# Patient Record
Sex: Female | Born: 1960 | Race: White | Hispanic: No | Marital: Married | Smoking: Never smoker
Health system: Southern US, Community
[De-identification: ages and names within clinical notes are randomized; demographics above are authoritative.]

## PROBLEM LIST (undated history)

## (undated) DIAGNOSIS — M1990 Unspecified osteoarthritis, unspecified site: Secondary | ICD-10-CM

## (undated) DIAGNOSIS — E7800 Pure hypercholesterolemia, unspecified: Secondary | ICD-10-CM

## (undated) DIAGNOSIS — K5730 Diverticulosis of large intestine without perforation or abscess without bleeding: Secondary | ICD-10-CM

## (undated) DIAGNOSIS — G4700 Insomnia, unspecified: Secondary | ICD-10-CM

## (undated) DIAGNOSIS — R7303 Prediabetes: Secondary | ICD-10-CM

## (undated) DIAGNOSIS — Z973 Presence of spectacles and contact lenses: Secondary | ICD-10-CM

## (undated) DIAGNOSIS — Z8619 Personal history of other infectious and parasitic diseases: Secondary | ICD-10-CM

## (undated) DIAGNOSIS — K219 Gastro-esophageal reflux disease without esophagitis: Secondary | ICD-10-CM

## (undated) DIAGNOSIS — B009 Herpesviral infection, unspecified: Secondary | ICD-10-CM

## (undated) DIAGNOSIS — E782 Mixed hyperlipidemia: Secondary | ICD-10-CM

## (undated) DIAGNOSIS — N393 Stress incontinence (female) (male): Secondary | ICD-10-CM

## (undated) DIAGNOSIS — N819 Female genital prolapse, unspecified: Secondary | ICD-10-CM

## (undated) HISTORY — DX: Pure hypercholesterolemia, unspecified: E78.00

## (undated) HISTORY — PX: WISDOM TOOTH EXTRACTION: SHX21

## (undated) HISTORY — DX: Prediabetes: R73.03

## (undated) HISTORY — PX: EYE SURGERY: SHX253

## (undated) HISTORY — DX: Herpesviral infection, unspecified: B00.9

## (undated) HISTORY — PX: BUNIONECTOMY: SHX129

---
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
## 2002-02-23 ENCOUNTER — Inpatient Hospital Stay (HOSPITAL_COMMUNITY): Admission: AD | Admit: 2002-02-23 | Discharge: 2002-02-24 | Payer: Self-pay | Admitting: Family Medicine

## 2002-02-23 ENCOUNTER — Encounter: Payer: Self-pay | Admitting: Family Medicine

## 2002-06-20 ENCOUNTER — Encounter: Payer: Self-pay | Admitting: Family Medicine

## 2002-06-20 ENCOUNTER — Ambulatory Visit (HOSPITAL_COMMUNITY): Admission: RE | Admit: 2002-06-20 | Discharge: 2002-06-20 | Payer: Self-pay | Admitting: Family Medicine

## 2004-09-17 ENCOUNTER — Ambulatory Visit (HOSPITAL_COMMUNITY): Admission: RE | Admit: 2004-09-17 | Discharge: 2004-09-17 | Payer: Self-pay | Admitting: Obstetrics and Gynecology

## 2011-06-30 ENCOUNTER — Other Ambulatory Visit (HOSPITAL_COMMUNITY): Payer: Self-pay | Admitting: Preventative Medicine

## 2011-06-30 DIAGNOSIS — M25369 Other instability, unspecified knee: Secondary | ICD-10-CM

## 2011-06-30 DIAGNOSIS — M25469 Effusion, unspecified knee: Secondary | ICD-10-CM

## 2011-07-01 ENCOUNTER — Other Ambulatory Visit (HOSPITAL_COMMUNITY): Payer: Self-pay

## 2011-07-16 ENCOUNTER — Other Ambulatory Visit (HOSPITAL_COMMUNITY): Payer: Self-pay

## 2011-07-16 ENCOUNTER — Ambulatory Visit (HOSPITAL_COMMUNITY)
Admission: RE | Admit: 2011-07-16 | Discharge: 2011-07-16 | Disposition: A | Discharge status: Home / Self Care | Payer: BC Managed Care – PPO | Source: Ambulatory Visit | Attending: Preventative Medicine | Admitting: Preventative Medicine

## 2011-07-16 ENCOUNTER — Inpatient Hospital Stay (HOSPITAL_COMMUNITY): Admission: RE | Admit: 2011-07-16 | Payer: Self-pay | Source: Ambulatory Visit

## 2011-07-16 DIAGNOSIS — X500XXA Overexertion from strenuous movement or load, initial encounter: Secondary | ICD-10-CM | POA: Insufficient documentation

## 2011-07-16 DIAGNOSIS — M25569 Pain in unspecified knee: Secondary | ICD-10-CM | POA: Insufficient documentation

## 2011-07-16 DIAGNOSIS — M25469 Effusion, unspecified knee: Secondary | ICD-10-CM | POA: Insufficient documentation

## 2011-07-16 DIAGNOSIS — M25369 Other instability, unspecified knee: Secondary | ICD-10-CM

## 2011-07-16 DIAGNOSIS — IMO0002 Reserved for concepts with insufficient information to code with codable children: Secondary | ICD-10-CM | POA: Insufficient documentation

## 2011-07-24 ENCOUNTER — Ambulatory Visit (HOSPITAL_COMMUNITY)
Admission: RE | Admit: 2011-07-24 | Discharge: 2011-07-24 | Disposition: A | Discharge status: Home / Self Care | Payer: BC Managed Care – PPO | Source: Ambulatory Visit | Attending: Orthopaedic Surgery | Admitting: Orthopaedic Surgery

## 2011-07-24 DIAGNOSIS — IMO0001 Reserved for inherently not codable concepts without codable children: Secondary | ICD-10-CM | POA: Insufficient documentation

## 2011-07-24 DIAGNOSIS — M25569 Pain in unspecified knee: Secondary | ICD-10-CM | POA: Insufficient documentation

## 2011-07-24 DIAGNOSIS — M25669 Stiffness of unspecified knee, not elsewhere classified: Secondary | ICD-10-CM | POA: Insufficient documentation

## 2011-07-24 DIAGNOSIS — R262 Difficulty in walking, not elsewhere classified: Secondary | ICD-10-CM | POA: Insufficient documentation

## 2011-07-24 DIAGNOSIS — M238X9 Other internal derangements of unspecified knee: Secondary | ICD-10-CM | POA: Insufficient documentation

## 2011-07-24 DIAGNOSIS — M6281 Muscle weakness (generalized): Secondary | ICD-10-CM | POA: Insufficient documentation

## 2011-07-30 ENCOUNTER — Telehealth (HOSPITAL_COMMUNITY): Payer: Self-pay

## 2011-07-30 ENCOUNTER — Inpatient Hospital Stay (HOSPITAL_COMMUNITY): Admission: RE | Admit: 2011-07-30 | Discharge: 2011-07-30 | Payer: BC Managed Care – PPO | Source: Ambulatory Visit

## 2011-07-30 NOTE — Progress Notes (Signed)
Physical Therapy Evaluation on 07/24/11  Patient Details  Name: Gabrielle Rodriguez MRN: [id number] Date of Birth: November 28, 1960  Today's Date: 07/30/2011 Time: 850-947 Charges: 1 eval Visit#: 1 of 8 Re-eval: 08/22/11    Past Medical History: No past medical history on file. Past Surgical History: No past surgical history on file.  Subjective  Symptoms/Limitations: Symptoms Injured her R knee while playing sand volleyball on 06/28/11. Waited to see MD about knee pain. Was placed in an immobolizer for a few weeks. Has ambulated independently and now ambulating with a neoprene sleeve.    Precautions/Restrictions     Prior Functioning     Cognition    Sensation/Coordination/Flexibility    Assessment  See Assessment section for strength/ROM and balance  Mobility (including Balance)       Exercise/Treatments  Supine: Quad Sets x5 SLR x10 SAQ x10 Active HS stretch 30sec Heel Slides Prone: SLR x10 Sidelying: Hip adduction x10 Physical Therapy Assessment and Plan PT Assessment and Plan Clinical Impression Statement: For IE on 07/24/11: Pt is a 50 y.o female referred to PT for R knee partial torn ACL.  After examination it was found that the patient has current body structure impairments including increased pain, decreased functional strength, decreased power, impaired balance, decreased ROM, and impaired perceived functional ability which are limiting her in the ability to participate in household and community related activities.  Pt will benefit from skilled physical therapy service to address the above body structure impairments in order to maximize function in order to improve quality of life. Rehab Potential: Good PT Frequency: Min 2X/week PT Duration: 4 weeks PT Treatment/Interventions: Gait training;Stair training;Functional mobility training;Therapeutic exercise;Other (comment) (Manual and modalities for ROM and pain control) PT Plan: Add: bike, steps, SAQ's, TKE, balance  exercise (maybe on foam)    Goals  Home Exercise Program: Pt will Perform Home Exercise Program PT Goal: Perform Home Exercise Program - Progress PT Short Term Goals: Time to Complete Short Term Goals PT Short Term Goal 1 1.Pt will be Independent in HEP in order to maximize therapeutic effect. PT Short Term Goal 1 - Progress PT Short Term Goal 2 2.Pt will report pain less than 2/10 while walking for 15 minutes. PT Short Term Goal 2 - Progress PT Short Term Goal 3 3.Pt will improve AROM to 0-130 PT Short Term Goal 3 - Progress PT Short Term Goal 4 4.Pt will ambulate with appropriate gait mechanics. PT Short Term Goal 4 - Progress PT Short Term Goal 5 5. Pt will descend stairs with 1 handrail and reciprocal gait pattern. PT Short Term Goal 5 - Progress Additional PT Short Term Goals? PT Long Term Goals: PT Long Term Goal 1 1.Pt will increase LE strength to [facility] in order to walk and jog and for an hour to participate in exercise activities. PT Long Term Goal 1 - Progress PT Long Term Goal 2 2.Pt will report pain less than or equal to 2/10 for 75% of her day with workout activities. PT Long Term Goal 2 - Progress Long Term Goal 3 3.Pt will improve her LEFS score by 9 points for improved perceived functional ability Long Term Goal 3 Progress Long Term Goal 4 4.Pt will improve RLE stability in order to demonstrate 5 V-cuts and 5 half kneel to stands without pain or knee popping. Long Term Goal 4 Progress    Problem List Patient Active Problem List  Diagnoses  . ACL laxity  . Knee pain  Kamareon Sciandra 07/30/2011, 10:04 AM  Physician Documentation Your signature is required to indicate approval of the treatment plan as stated above.  Please sign and either send electronically or make a copy of this report for your files and return this physician signed original.   Please mark one 1.__approve of plan  2. ___approve of plan with the following conditions.   ______________________________                                                           _____________________ Physician Signature                                                                                                             Date

## 2011-07-31 ENCOUNTER — Ambulatory Visit (HOSPITAL_COMMUNITY): Payer: BC Managed Care – PPO | Admitting: Physical Therapy

## 2011-07-31 ENCOUNTER — Telehealth (HOSPITAL_COMMUNITY): Payer: Self-pay

## 2011-08-04 ENCOUNTER — Ambulatory Visit (HOSPITAL_COMMUNITY)
Admission: RE | Admit: 2011-08-04 | Discharge: 2011-08-04 | Disposition: A | Discharge status: Home / Self Care | Payer: BC Managed Care – PPO | Source: Ambulatory Visit | Attending: Orthopaedic Surgery | Admitting: Orthopaedic Surgery

## 2011-08-04 NOTE — Progress Notes (Signed)
Physical Therapy Treatment Patient Details  Name: Gabrielle Rodriguez MRN: [id number] Date of Birth: [birth year]  Today's Date: 08/04/2011 Time: 1450-1530 Time Calculation (min): 40 min Visit#: 2  of 8   Re-eval: 08/22/11 Charges:  Therex 28', ice 10'    Subjective: Symptoms/Limitations Symptoms: Pt. states she was on her knee more yesterday and hurting/throbbing a little more today. Pain Assessment Currently in Pain?: Yes Pain Score:   6 Pain Location: Knee Pain Orientation: Right Pain Type:  ("Nagging/Non-stop")  Precautions/Restrictions:   Partially torn R ACL; avoid exercises that cause pain.   Exercise/Treatments Warm Up: Recumbent bike 6' @ 2.0, seat 10 Standing:  Heelraises 10X Hamstring curls 10X Forward step ups 10X 4" step SLS  Max 50" Terminal Knee extension, blue tband 10X5" Supine:  Quad Sets10X5"  SLR x10  SAQ x10 X5" Active HS stretch 30secX 3  Heel Slides 10X Prone:  Hamstring curls 10X Hip Extension x10  Sidelying:  Hip adduction x10   Modalities Modalities: Cryotherapy Cryotherapy Number Minutes Cryotherapy: 10 Minutes Cryotherapy Location: Knee Type of Cryotherapy: Ice pack  Physical Therapy Assessment and Plan PT Assessment and Plan Clinical Impression Statement: Added TKE, heelraises, knee flexion , and forward step ups.  Attempted lateral step-ups, however too painful for patient.   PT Treatment/Interventions: Therapeutic exercise (icepack) PT Plan: Progress strength and stability of right knee per POC.     Problem List Patient Active Problem List  Diagnoses  . ACL laxity  . Knee pain    PT - End of Session Activity Tolerance: Patient tolerated treatment well General Behavior During Session: [facility] for tasks performed Cognition: [facility] for tasks performed  Lurena Nida 08/04/2011, 3:24 PM

## 2011-08-07 ENCOUNTER — Ambulatory Visit (HOSPITAL_COMMUNITY)
Admission: RE | Admit: 2011-08-07 | Discharge: 2011-08-07 | Disposition: A | Discharge status: Home / Self Care | Payer: BC Managed Care – PPO | Source: Ambulatory Visit

## 2011-08-07 NOTE — Progress Notes (Signed)
Physical Therapy Treatment Patient Details  Name: Gabrielle Rodriguez MRN: [id number] Date of Birth: Sep 17, 1961  Today's Date: 08/07/2011 Time: 5621-3086 Time Calculation (min): 42 min Visit#: 3  of 8   Re-eval: 08/22/11  Charge: therex 26 min Ice 10 min  Subjective: Symptoms/Limitations Symptoms: Sore today believe its because I'm doing more.  Pain scale 1-2/10 right now, pain worse at night up to 5-6/10. Pain Assessment Currently in Pain?: Yes Pain Score:   1 Pain Location: Knee Pain Orientation: Right  Precautions/Restrictions: Partially torn R ACL; avoid exercises that cause pain.  Exercise/Treatments Warm Up:  Recumbent bike 6' @ 3.0, seat 10  Standing:  Heelraises 15X  Hamstring curls 10X  Forward step ups 10X 4" step  Step down 10x 4" step Lateral step ups 10x 4" step SLS Max 1\' 12"   Vector stance 5x 5" Terminal Knee extension, blue tband 10X5"  Supine:  Quad Sets10X5"  Heel Slides 15X  SLR x10 3# SAQ x15 X5"  Ice x 10 min  Physical Therapy Assessment and Plan PT Assessment and Plan Clinical Impression Statement: Able to tolerate lateral step ups and forward step down today without c/o of increased pain.  Pt presented good ankle strategy with SLS.  Able to advance therex with no increased pain.  ROM improving, AROM extension lacking 2 degrees. PT Plan: Progress strength and stability of R Knee.  Progress to SLS on foam or rebounder next session.    Goals    Problem List Patient Active Problem List  Diagnoses  . ACL laxity  . Knee pain    PT - End of Session Activity Tolerance: Patient tolerated treatment well General Behavior During Session: [facility] for tasks performed Cognition: [facility] for tasks performed  Juel Burrow 08/07/2011, 11:19 AM

## 2011-08-12 ENCOUNTER — Telehealth (HOSPITAL_COMMUNITY): Payer: Self-pay

## 2011-08-14 ENCOUNTER — Ambulatory Visit (HOSPITAL_COMMUNITY)
Admission: RE | Admit: 2011-08-14 | Discharge: 2011-08-14 | Disposition: A | Discharge status: Home / Self Care | Payer: BC Managed Care – PPO | Source: Ambulatory Visit | Attending: Orthopaedic Surgery | Admitting: Orthopaedic Surgery

## 2011-08-14 DIAGNOSIS — IMO0001 Reserved for inherently not codable concepts without codable children: Secondary | ICD-10-CM | POA: Insufficient documentation

## 2011-08-14 DIAGNOSIS — R262 Difficulty in walking, not elsewhere classified: Secondary | ICD-10-CM | POA: Insufficient documentation

## 2011-08-14 DIAGNOSIS — M25569 Pain in unspecified knee: Secondary | ICD-10-CM | POA: Insufficient documentation

## 2011-08-14 DIAGNOSIS — M25669 Stiffness of unspecified knee, not elsewhere classified: Secondary | ICD-10-CM | POA: Insufficient documentation

## 2011-08-14 DIAGNOSIS — M6281 Muscle weakness (generalized): Secondary | ICD-10-CM | POA: Insufficient documentation

## 2011-08-14 NOTE — Progress Notes (Signed)
Physical Therapy Treatment Patient Details  Name: Gabrielle Rodriguez MRN: [id number] Date of Birth: August 10, 1961  Today's Date: 08/14/2011 Time: 7829-5621 Time Calculation (min): 29 min Visit#: 4  of 8   Re-eval: 08/22/11 Charges:  therex 15', ice 10' (unable to complete full program)   Precautions/Restrictions: Partially torn R ACL; avoid exercises that cause pain.   Subjective: Symptoms/Limitations Symptoms: Pt. 20' late for appt. today and had to leave early for work.  States it still pops at times, especially with going from flexion to extension.  Reports constant, chronic ache in R knee. Pain Assessment Pain Score:   2 Pain Location: Knee Pain Orientation: Right  Exercise/Treatments Warm Up:  Recumbent bike 6' @ 3.0, seat 10  Standing:  Lateral step ups 15x 4" step  Rebounder red ball 10X R LE only Supine:  SLR x15 3#  Ice x 10 min   Modalities Modalities: Cryotherapy Cryotherapy Number Minutes Cryotherapy: 10 Minutes Cryotherapy Location: Knee Type of Cryotherapy: Ice pack  Physical Therapy Assessment and Plan PT Assessment and Plan Clinical Impression Statement: Unable to complete full program secondary to pt being late/having to leave early.  Audible pop with lateral step up when came into full extension; performed remainder without going into full extension  without difficulty. PT Treatment/Interventions: Therapeutic exercise (icepack) PT Plan: Progress strength to increase knee stability.     Problem List Patient Active Problem List  Diagnoses  . ACL laxity  . Knee pain    PT - End of Session Activity Tolerance: Patient tolerated treatment well General Behavior During Session: [facility] for tasks performed Cognition: [facility] for tasks performed  Bascom Levels, [place] B 08/14/2011, 11:12 AM

## 2011-08-15 ENCOUNTER — Ambulatory Visit (HOSPITAL_COMMUNITY)
Admission: RE | Admit: 2011-08-15 | Discharge: 2011-08-15 | Disposition: A | Discharge status: Home / Self Care | Payer: BC Managed Care – PPO | Source: Ambulatory Visit

## 2011-08-15 NOTE — Progress Notes (Signed)
Physical Therapy Treatment Patient Details  Name: Gabrielle Rodriguez MRN: [id number] Date of Birth: [birth year]  Today's Date: 08/15/2011 Time: 7829-5621 Time Calculation (min): 63 min Visit#: 5  of 8   Re-eval: 08/22/11  Charge: therex 45 min Ice 10 min  Subjective: Symptoms/Limitations Symptoms: Feeling good today, had to stand a lot yesterday so was a little tired, no pain, Pain Assessment Currently in Pain?: No/denies  Objective:   Exercise/Treatments Warm Up:  Recumbent bike 6' @ 4.0, seat 9 Standing:  Heelraises  20x Heel walk 1 RT Toe walk 1RT Hamstring curls 2 sets 10 reps with 3#  Forward step ups 2 sets 10X 4" step  Step down 15x 4" step  Lateral step ups 15x 4" step  Functional squat 20 x SLS Max 1' on foam Vector stance 3x 10"   Terminal Knee extension, blue tband 20X5"  Supine:  Quad Sets15X5"  SLR x15 3#  SAQ x15 X5" 3# Sidelying:  Hip Adduction 10 x 3# Prone:  Hip extension 10x 3# Ice x 10 min  Physical Therapy Assessment and Plan PT Assessment and Plan Clinical Impression Statement: Advanced therex per initial eval weaknesses.  Pt tolerated increased reps and added weight and began cybex quad and ham st without difficulty.  Audible pop with terminal knee extension, performed remainder of knee extension therex without going into full extension without difficutly. PT Plan: Progress strength to increase knee stability.    Goals    Problem List Patient Active Problem List  Diagnoses  . ACL laxity  . Knee pain    PT - End of Session Activity Tolerance: Patient tolerated treatment well General Behavior During Session: [facility] for tasks performed Cognition: [facility] for tasks performed  Juel Burrow 08/15/2011, 11:47 AM

## 2011-08-19 ENCOUNTER — Ambulatory Visit (HOSPITAL_COMMUNITY)
Admission: RE | Admit: 2011-08-19 | Discharge: 2011-08-19 | Disposition: A | Discharge status: Home / Self Care | Payer: BC Managed Care – PPO | Source: Ambulatory Visit | Attending: Physical Therapy | Admitting: Physical Therapy

## 2011-08-19 NOTE — Progress Notes (Addendum)
Physical Therapy Treatment Patient Details  Name: Gabrielle Rodriguez MRN: [id number] Date of Birth: 09-08-1961  Today's Date: 08/19/2011 Time: 1022-1106 Time Calculation (min): 44 min Charges: 52' TE, 1 ice Visit#: 6  of 8   Re-eval: 08/22/11    Subjective: Symptoms/Limitations Symptoms: Pt reports that she still has some unsteadiness in her knee, however overall she feels like she is doing really well.   Pain Assessment Currently in Pain?: No/denies  Exercise/Treatments Aerobic Elliptical: 7' 4.0 Machines for Strengthening Cybex Knee Extension: 2.5 PL 2x10 Cybex Knee Flexion: 4.5 PL 2x10 Standing Knee Flexion: 20 reps;Limitations Knee Flexion Limitations: 4# Wall Squat: 10 reps;5 seconds;Limitations Wall Squat Limitations: w/hip abduction Lunge Walking - Round Trips: 1RT Stairs: 2 RT independent ascend/descend, 1 RT lateral SLS with Vectors: 5x5" each direction w/o touchdown Walking with Sports Cord: 5x each direction ( 4 directions)    Physical Therapy Assessment and Plan PT Assessment and Plan Clinical Impression Statement: Pt tolerated higher level exercises without an increase in pain to her knee.  She continues to demonstrate mild-moderate difficulty with eccentric quadricep movement with moderate decrease in muscular endurance.  PT Plan: Continue to progress higher level activitivies    Goals    Problem List Patient Active Problem List  Diagnoses  . ACL laxity  . Knee pain       Dionel Archey 08/19/2011, 10:59 AM

## 2011-08-21 ENCOUNTER — Telehealth (HOSPITAL_COMMUNITY): Payer: Self-pay

## 2011-08-21 ENCOUNTER — Inpatient Hospital Stay (HOSPITAL_COMMUNITY): Admission: RE | Admit: 2011-08-21 | Payer: BC Managed Care – PPO | Source: Ambulatory Visit

## 2011-08-26 ENCOUNTER — Ambulatory Visit (HOSPITAL_COMMUNITY): Payer: BC Managed Care – PPO | Admitting: Physical Therapy

## 2013-07-12 ENCOUNTER — Encounter (HOSPITAL_COMMUNITY): Payer: Self-pay | Admitting: Pharmacist

## 2013-07-18 ENCOUNTER — Other Ambulatory Visit: Payer: Self-pay | Admitting: Obstetrics & Gynecology

## 2013-07-25 ENCOUNTER — Encounter (HOSPITAL_COMMUNITY): Payer: Self-pay

## 2013-07-25 ENCOUNTER — Encounter (HOSPITAL_COMMUNITY)
Admission: RE | Admit: 2013-07-25 | Discharge: 2013-07-25 | Disposition: A | Discharge status: Home / Self Care | Payer: BC Managed Care – PPO | Source: Ambulatory Visit | Attending: Obstetrics & Gynecology | Admitting: Obstetrics & Gynecology

## 2013-07-25 DIAGNOSIS — Z01812 Encounter for preprocedural laboratory examination: Secondary | ICD-10-CM | POA: Insufficient documentation

## 2013-07-25 DIAGNOSIS — Z01818 Encounter for other preprocedural examination: Secondary | ICD-10-CM | POA: Insufficient documentation

## 2013-07-25 LAB — CBC
MCH: 31.5 pg (ref 26.0–34.0)
MCHC: 33.3 g/dL (ref 30.0–36.0)
Platelets: 245 10*3/uL (ref 150–400)

## 2013-07-25 LAB — TYPE AND SCREEN: ABO/RH(D): A POS

## 2013-07-25 NOTE — Patient Instructions (Addendum)
   Your procedure is scheduled on: Friday, 9/19  Enter through the Main Entrance of [facility] at: 6 am Pick up the phone at the desk and dial [phone] and inform us of your arrival.  Please call this number if you have any problems the morning of surgery: [phone]  Remember: Do not eat or drink midnight: Thursday: Take these medicines the morning of surgery with a SIP OF WATER:  None  Do not wear jewelry, make-up, or FINGER nail polish No metal in your hair or on your body. Do not wear lotions, powders, perfumes. You may wear deodorant.  Please use your CHG wash as directed prior to surgery.  Do not shave anywhere for at least 12 hours prior to first CHG shower.  Do not bring valuables to the hospital. Contacts, dentures or bridgework may not be worn into surgery.  Leave suitcase in the car. After Surgery it may be brought to your room. For patients being admitted to the hospital, checkout time is 11:00am the day of discharge.  Home with husband Molly Maduro.

## 2013-07-28 ENCOUNTER — Encounter (HOSPITAL_COMMUNITY): Payer: Self-pay | Admitting: Anesthesiology

## 2013-07-28 MED ORDER — CEFAZOLIN SODIUM-DEXTROSE 2-3 GM-% IV SOLR
2.0000 g | INTRAVENOUS | Status: AC
  Administered 2013-07-29: 2 g via INTRAVENOUS

## 2013-07-28 NOTE — Anesthesia Preprocedure Evaluation (Addendum)
Anesthesia Evaluation  Patient identified by MRN, date of birth, ID band  Reviewed: Allergy & Precautions, H&P , NPO status , Patient's Chart, lab work & pertinent test results  Airway Mallampati: II TM Distance: >3 FB Neck ROM: Full    Dental no notable dental hx. (+) Teeth Intact   Pulmonary neg pulmonary ROS,  breath sounds clear to auscultation  Pulmonary exam normal       Cardiovascular negative cardio ROS  Rhythm:Regular Rate:Normal     Neuro/Psych negative neurological ROS  negative psych ROS   GI/Hepatic negative GI ROS, Neg liver ROS,   Endo/Other  negative endocrine ROS  Renal/GU negative Renal ROS  negative genitourinary   Musculoskeletal negative musculoskeletal ROS (+)   Abdominal   Peds  Hematology negative hematology ROS (+)   Anesthesia Other Findings   Reproductive/Obstetrics Cystocele Uterine Prolapse                          Anesthesia Physical Anesthesia Plan  ASA: I  Anesthesia Plan: General   Post-op Pain Management:    Induction: Intravenous  Airway Management Planned: Oral ETT  Additional Equipment:   Intra-op Plan:   Post-operative Plan: Extubation in OR  Informed Consent: I have reviewed the patients History and Physical, chart, labs and discussed the procedure including the risks, benefits and alternatives for the proposed anesthesia with the patient or authorized representative who has indicated his/her understanding and acceptance.   Dental advisory given  Plan Discussed with: CRNA, Anesthesiologist and Surgeon  Anesthesia Plan Comments:         Anesthesia Quick Evaluation

## 2013-07-29 ENCOUNTER — Ambulatory Visit (HOSPITAL_COMMUNITY): Payer: BC Managed Care – PPO | Admitting: Anesthesiology

## 2013-07-29 ENCOUNTER — Encounter (HOSPITAL_COMMUNITY): Payer: Self-pay | Admitting: Anesthesiology

## 2013-07-29 ENCOUNTER — Encounter (HOSPITAL_COMMUNITY): Payer: Self-pay | Admitting: *Deleted

## 2013-07-29 ENCOUNTER — Encounter (HOSPITAL_COMMUNITY)
Admission: RE | Disposition: A | Discharge status: Home / Self Care | Payer: Self-pay | Source: Ambulatory Visit | Attending: Obstetrics & Gynecology

## 2013-07-29 ENCOUNTER — Ambulatory Visit (HOSPITAL_COMMUNITY)
Admission: RE | Admit: 2013-07-29 | Discharge: 2013-07-30 | Disposition: A | Discharge status: Home / Self Care | Payer: BC Managed Care – PPO | Source: Ambulatory Visit | Attending: Obstetrics & Gynecology | Admitting: Obstetrics & Gynecology

## 2013-07-29 DIAGNOSIS — N8000 Endometriosis of the uterus, unspecified: Secondary | ICD-10-CM | POA: Insufficient documentation

## 2013-07-29 DIAGNOSIS — N812 Incomplete uterovaginal prolapse: Secondary | ICD-10-CM | POA: Insufficient documentation

## 2013-07-29 DIAGNOSIS — N72 Inflammatory disease of cervix uteri: Secondary | ICD-10-CM | POA: Insufficient documentation

## 2013-07-29 HISTORY — PX: VAGINAL HYSTERECTOMY: SHX2639

## 2013-07-29 HISTORY — PX: CYSTOCELE REPAIR: SHX163

## 2013-07-29 LAB — PREGNANCY, URINE: Preg Test, Ur: NEGATIVE

## 2013-07-29 LAB — TYPE AND SCREEN: ABO/RH(D): A POS

## 2013-07-29 SURGERY — HYSTERECTOMY, VAGINAL
Anesthesia: General | Site: Uterus | Wound class: Clean Contaminated

## 2013-07-29 MED ORDER — NEOSTIGMINE METHYLSULFATE 1 MG/ML IJ SOLN
INTRAMUSCULAR | Status: DC | PRN
  Administered 2013-07-29: 2 mg via INTRAVENOUS

## 2013-07-29 MED ORDER — LACTATED RINGERS IV SOLN
INTRAVENOUS | Status: DC
  Administered 2013-07-29 (×3): via INTRAVENOUS

## 2013-07-29 MED ORDER — LIDOCAINE-EPINEPHRINE 1 %-1:100000 IJ SOLN
INTRAMUSCULAR | Status: DC | PRN
  Administered 2013-07-29 (×2): 20 mL

## 2013-07-29 MED ORDER — CEFAZOLIN SODIUM-DEXTROSE 2-3 GM-% IV SOLR
INTRAVENOUS | Status: AC
  Filled 2013-07-29: qty 50

## 2013-07-29 MED ORDER — MIDAZOLAM HCL 2 MG/2ML IJ SOLN
INTRAMUSCULAR | Status: AC
  Filled 2013-07-29: qty 2

## 2013-07-29 MED ORDER — ESTRADIOL 0.1 MG/GM VA CREA
TOPICAL_CREAM | VAGINAL | Status: AC
  Filled 2013-07-29: qty 42.5

## 2013-07-29 MED ORDER — ONDANSETRON HCL 4 MG/2ML IJ SOLN
INTRAMUSCULAR | Status: DC | PRN
  Administered 2013-07-29: 4 mg via INTRAVENOUS

## 2013-07-29 MED ORDER — ROCURONIUM BROMIDE 100 MG/10ML IV SOLN
INTRAVENOUS | Status: DC | PRN
  Administered 2013-07-29: 35 mg via INTRAVENOUS

## 2013-07-29 MED ORDER — MIDAZOLAM HCL 2 MG/2ML IJ SOLN
INTRAMUSCULAR | Status: DC | PRN
  Administered 2013-07-29: 2 mg via INTRAVENOUS

## 2013-07-29 MED ORDER — PROPOFOL 10 MG/ML IV BOLUS
INTRAVENOUS | Status: DC | PRN
  Administered 2013-07-29: 150 mg via INTRAVENOUS
  Administered 2013-07-29: 30 mg via INTRAVENOUS

## 2013-07-29 MED ORDER — ROCURONIUM BROMIDE 50 MG/5ML IV SOLN
INTRAVENOUS | Status: AC
  Filled 2013-07-29: qty 1

## 2013-07-29 MED ORDER — SUFENTANIL CITRATE 50 MCG/ML IV SOLN: INTRAVENOUS | Status: DC | PRN

## 2013-07-29 MED ORDER — INFLUENZA VAC SPLIT QUAD 0.5 ML IM SUSP
0.5000 mL | INTRAMUSCULAR | Status: AC
  Administered 2013-07-30: 0.5 mL via INTRAMUSCULAR
  Filled 2013-07-29: qty 0.5

## 2013-07-29 MED ORDER — ESTRADIOL 0.1 MG/GM VA CREA
TOPICAL_CREAM | VAGINAL | Status: DC | PRN
  Administered 2013-07-29: 1 via VAGINAL

## 2013-07-29 MED ORDER — FENTANYL CITRATE 0.05 MG/ML IJ SOLN
INTRAMUSCULAR | Status: DC | PRN
  Administered 2013-07-29: 75 ug via INTRAVENOUS
  Administered 2013-07-29 (×2): 50 ug via INTRAVENOUS
  Administered 2013-07-29: 75 ug via INTRAVENOUS

## 2013-07-29 MED ORDER — PROPOFOL 10 MG/ML IV EMUL
INTRAVENOUS | Status: AC
  Filled 2013-07-29: qty 20

## 2013-07-29 MED ORDER — LIDOCAINE HCL (CARDIAC) 20 MG/ML IV SOLN
INTRAVENOUS | Status: AC
  Filled 2013-07-29: qty 5

## 2013-07-29 MED ORDER — METOCLOPRAMIDE HCL 5 MG/ML IJ SOLN: 10.0000 mg | Freq: Once | INTRAMUSCULAR | Status: DC | PRN

## 2013-07-29 MED ORDER — HYDROMORPHONE HCL PF 1 MG/ML IJ SOLN
1.0000 mg | INTRAMUSCULAR | Status: DC | PRN
  Administered 2013-07-29 (×2): 1 mg via INTRAVENOUS
  Filled 2013-07-29 (×2): qty 1

## 2013-07-29 MED ORDER — SCOPOLAMINE 1 MG/3DAYS TD PT72
MEDICATED_PATCH | TRANSDERMAL | Status: AC
  Administered 2013-07-29: 1.5 mg via TRANSDERMAL
  Filled 2013-07-29: qty 1

## 2013-07-29 MED ORDER — DEXAMETHASONE SODIUM PHOSPHATE 10 MG/ML IJ SOLN
INTRAMUSCULAR | Status: DC | PRN
  Administered 2013-07-29: 10 mg via INTRAVENOUS

## 2013-07-29 MED ORDER — SCOPOLAMINE 1 MG/3DAYS TD PT72
1.0000 | MEDICATED_PATCH | TRANSDERMAL | Status: DC
  Administered 2013-07-29: 1.5 mg via TRANSDERMAL

## 2013-07-29 MED ORDER — GLYCOPYRROLATE 0.2 MG/ML IJ SOLN
INTRAMUSCULAR | Status: AC
  Filled 2013-07-29: qty 2

## 2013-07-29 MED ORDER — FENTANYL CITRATE 0.05 MG/ML IJ SOLN
INTRAMUSCULAR | Status: AC
  Filled 2013-07-29: qty 5

## 2013-07-29 MED ORDER — GLYCOPYRROLATE 0.2 MG/ML IJ SOLN
INTRAMUSCULAR | Status: DC | PRN
  Administered 2013-07-29: 0.2 mg via INTRAVENOUS
  Administered 2013-07-29: 0.4 mg via INTRAVENOUS
  Administered 2013-07-29: 0.2 mg via INTRAVENOUS

## 2013-07-29 MED ORDER — FENTANYL CITRATE 0.05 MG/ML IJ SOLN
INTRAMUSCULAR | Status: AC
  Administered 2013-07-29: 50 ug via INTRAVENOUS
  Filled 2013-07-29: qty 2

## 2013-07-29 MED ORDER — LACTATED RINGERS IV SOLN
INTRAVENOUS | Status: DC
  Administered 2013-07-29 – 2013-07-30 (×2): via INTRAVENOUS

## 2013-07-29 MED ORDER — 0.9 % SODIUM CHLORIDE (POUR BTL) OPTIME
TOPICAL | Status: DC | PRN
  Administered 2013-07-29: 1000 mL

## 2013-07-29 MED ORDER — ONDANSETRON HCL 4 MG/2ML IJ SOLN
INTRAMUSCULAR | Status: AC
  Filled 2013-07-29: qty 2

## 2013-07-29 MED ORDER — MEPERIDINE HCL 25 MG/ML IJ SOLN: 6.2500 mg | INTRAMUSCULAR | Status: DC | PRN

## 2013-07-29 MED ORDER — DEXAMETHASONE SODIUM PHOSPHATE 10 MG/ML IJ SOLN
INTRAMUSCULAR | Status: AC
  Filled 2013-07-29: qty 1

## 2013-07-29 MED ORDER — BUPIVACAINE HCL (PF) 0.25 % IJ SOLN
INTRAMUSCULAR | Status: AC
  Filled 2013-07-29: qty 30

## 2013-07-29 MED ORDER — LIDOCAINE HCL (CARDIAC) 20 MG/ML IV SOLN
INTRAVENOUS | Status: DC | PRN
  Administered 2013-07-29: 80 mg via INTRAVENOUS

## 2013-07-29 MED ORDER — FENTANYL CITRATE 0.05 MG/ML IJ SOLN
25.0000 ug | INTRAMUSCULAR | Status: DC | PRN
  Administered 2013-07-29: 50 ug via INTRAVENOUS

## 2013-07-29 MED ORDER — IBUPROFEN 600 MG PO TABS
600.0000 mg | ORAL_TABLET | Freq: Four times a day (QID) | ORAL | Status: DC | PRN
  Administered 2013-07-30: 600 mg via ORAL
  Filled 2013-07-29: qty 1

## 2013-07-29 MED ORDER — PHENYLEPHRINE HCL 10 MG/ML IJ SOLN
INTRAMUSCULAR | Status: DC | PRN
  Administered 2013-07-29: 80 ug via INTRAVENOUS

## 2013-07-29 MED ORDER — OXYCODONE-ACETAMINOPHEN 5-325 MG PO TABS
1.0000 | ORAL_TABLET | ORAL | Status: DC | PRN
  Administered 2013-07-29 – 2013-07-30 (×4): 2 via ORAL
  Administered 2013-07-30: 1 via ORAL
  Administered 2013-07-30: 2 via ORAL
  Filled 2013-07-29 (×4): qty 2
  Filled 2013-07-29: qty 1
  Filled 2013-07-29: qty 2

## 2013-07-29 SURGICAL SUPPLY — 39 items
BLADE SURG 15 STRL LF C SS BP (BLADE) ×1 IMPLANT
BLADE SURG 15 STRL SS (BLADE) ×2
CANISTER SUCTION 2500CC (MISCELLANEOUS) ×2 IMPLANT
CLOTH BEACON ORANGE TIMEOUT ST (SAFETY) ×2 IMPLANT
CONT PATH 16OZ SNAP LID 3702 (MISCELLANEOUS) IMPLANT
DECANTER SPIKE VIAL GLASS SM (MISCELLANEOUS) IMPLANT
DRAPE STERI URO 9X17 APER PCH (DRAPES) ×2 IMPLANT
DRESSING TELFA 8X3 (GAUZE/BANDAGES/DRESSINGS) ×2 IMPLANT
ELECT LIGASURE LONG (ELECTRODE) IMPLANT
ELECT LIGASURE SHORT 9 REUSE (ELECTRODE) ×1 IMPLANT
GAUZE PACKING 2X5 YD STERILE (GAUZE/BANDAGES/DRESSINGS) ×1 IMPLANT
GLOVE BIO SURGEON STRL SZ 6.5 (GLOVE) ×2 IMPLANT
GLOVE BIOGEL PI IND STRL 7.0 (GLOVE) ×1 IMPLANT
GLOVE BIOGEL PI INDICATOR 7.0 (GLOVE) ×1
GOWN PREVENTION PLUS LG XLONG (DISPOSABLE) ×8 IMPLANT
GOWN STRL REIN XL XLG (GOWN DISPOSABLE) ×8 IMPLANT
NDL SPNL 22GX3.5 QUINCKE BK (NEEDLE) IMPLANT
NEEDLE HYPO 22GX1.5 SAFETY (NEEDLE) ×2 IMPLANT
NEEDLE SPNL 22GX3.5 QUINCKE BK (NEEDLE) IMPLANT
PACK VAGINAL WOMENS (CUSTOM PROCEDURE TRAY) ×2 IMPLANT
PAD OB MATERNITY 4.3X12.25 (PERSONAL CARE ITEMS) ×2 IMPLANT
SUT VIC AB 0 CT1 27 (SUTURE) ×2
SUT VIC AB 0 CT1 27XBRD ANBCTR (SUTURE) ×1 IMPLANT
SUT VIC AB 2-0 CT1 27 (SUTURE) ×4
SUT VIC AB 2-0 CT1 TAPERPNT 27 (SUTURE) ×2 IMPLANT
SUT VIC AB 2-0 CT2 27 (SUTURE) IMPLANT
SUT VIC AB 2-0 SH 27 (SUTURE) ×4
SUT VIC AB 2-0 SH 27XBRD (SUTURE) ×3 IMPLANT
SUT VIC AB 2-0 UR5 27 (SUTURE) ×5 IMPLANT
SUT VIC AB 3-0 SH 27 (SUTURE)
SUT VIC AB 3-0 SH 27X BRD (SUTURE) IMPLANT
SUT VICRYL #0 CTB 1 (SUTURE) ×5 IMPLANT
SUT VICRYL 0 UR6 27IN ABS (SUTURE) ×6 IMPLANT
SUT VICRYL 1 TIES 12X18 (SUTURE) ×2 IMPLANT
SUT VICRYL 2 0 18  UND BR (SUTURE)
SUT VICRYL 2 0 18 UND BR (SUTURE) IMPLANT
TOWEL OR 17X24 6PK STRL BLUE (TOWEL DISPOSABLE) ×4 IMPLANT
TRAY FOLEY CATH 14FR (SET/KITS/TRAYS/PACK) ×2 IMPLANT
WATER STERILE IRR 1000ML POUR (IV SOLUTION) ×4 IMPLANT

## 2013-07-29 NOTE — H&P (Signed)
Gabrielle Rodriguez is an 52 y.o. female  G54P2A2 married  RP:  Uterine prolapse and cystocele for Vaginal hysterectomy, McCalls, Cystocele repair   Pertinent Gynecological History: Menses: Menopause Bleeding: none Contraception: none Blood transfusions: none Sexually transmitted diseases: no past history Previous GYN Procedures: None  Last mammogram: normal Last pap: normal  OB History: G4P2A2  C/S   Menstrual History: No LMP recorded.    Past Medical History  Diagnosis Date  . SVD (spontaneous vaginal delivery)     x 1    Past Surgical History  Procedure Laterality Date  . Eye surgery      eye surgery at age 66 yrs  . Cesarean section      x 1  . Wisdom tooth extraction      History reviewed. No pertinent family history.  Social History:  reports that she has never smoked. She has never used smokeless tobacco. She reports that  drinks alcohol. She reports that she does not use illicit drugs.  Allergies: No Known Allergies  Prescriptions prior to admission  Medication Sig Dispense Refill  . ibuprofen (ADVIL,MOTRIN) 400 MG tablet Take 400 mg by mouth every 6 (six) hours as needed for pain.      . predniSONE (DELTASONE) 5 MG tablet Take 5 mg by mouth taper from 4 doses each day to 1 dose and stop.      . chlorpheniramine-pseudoephedrine-acetaminophen (SINUTAB) 2-30-500 MG per tablet Take 1 tablet by mouth every 4 (four) hours as needed for allergies or congestion.        Pulse 56, temperature 97.6 F (36.4 C), temperature source Oral, resp. rate 20, height 5\' 6"  (1.676 m), weight 61.236 kg (135 lb), SpO2 100.00%.  Uterine prolapse/cystocele  Results for orders placed during the hospital encounter of 07/29/13 (from the past 24 hour(s))  PREGNANCY, URINE     Status: None   Collection Time    07/29/13  6:29 AM      Result Value Range   Preg Test, Ur NEGATIVE  NEGATIVE    No results found.  Assessment/Plan: Sxic uterine prolapse/cystocele for Vaginal  hysterectomy/McCalls/Cystocele repair.  Surgery and risks reviewed.  Dyllan Hughett,MARIE-LYNE 07/29/2013, 7:14 AM

## 2013-07-29 NOTE — Transfer of Care (Signed)
Immediate Anesthesia Transfer of Care Note  Patient: Gabrielle Rodriguez  Procedure(s) Performed: Procedure(s): HYSTERECTOMY VAGINAL with McCalls (N/A) ANTERIOR REPAIR (CYSTOCELE) (N/A)  Patient Location: PACU  Anesthesia Type:General  Level of Consciousness: awake, alert  and oriented  Airway & Oxygen Therapy: Patient Spontanous Breathing and Patient connected to nasal cannula oxygen  Post-op Assessment: Report given to PACU RN, Post -op Vital signs reviewed and stable and Patient moving all extremities  Post vital signs: Reviewed and stable  Complications: No apparent anesthesia complications

## 2013-07-29 NOTE — Op Note (Signed)
07/29/2013  9:07 AM  PATIENT:  Gabrielle Rodriguez  52 y.o. female  PRE-OPERATIVE DIAGNOSIS:  Cystocele, Uterine Prolapse    POST-OPERATIVE DIAGNOSIS:  cystocele , uterine prolaspe   PROCEDURE:  Procedure(s): HYSTERECTOMY VAGINAL with McCalls ANTERIOR REPAIR (CYSTOCELE)  SURGEON:  Surgeon(s): Genia Del, MD Robley Fries, MD  ASSISTANT:  Robley Fries, MD  ANESTHESIA:   general  PROCEDURE:  Under general anesthesia with endotracheal intubation the patient is in lithotomy position. She is prepped with Betadine on the suprapubic, vulvar and vaginal areas. She is draped as usual.  The weighted speculum is inserted in the vagina. We grasped the cervix with a tenaculum.  Infiltration of lidocaine with epinephrine circumferentially at the junction between cervix and vaginal mucosa.  We then opened at that location with the electrocautery.  The vaginal mucosa was then pushed with sponge on a finger.  The visceral peritoneum was opened anteriorly and the retractor was inserted at that level.  We then did the same posteriorly opened the visceral peritoneum and the inserted the long narrow weighted speculum at that level. We then grasped the left uterosacral ligament with a curved Heaney, sectioned with scissors and suture with a Heaney stitch of Vicryl 0.  We kept the stitch on a hemostat.  We proceeded the same way on the right side. We then used the LigaSure to cauterize then section with Metzenbaums scissors the cardinal ligaments and then the uterine artery on the right and left side.   We then cauterized and section the left utero-ovarian ligament.  Continued with on the left tube and the round ligament.  We proceeded the same way on the right side. The uterus was completely detached with the cervix and sent to pathology.  Both ovaries were normal to inspection.  The tubes were too high for safe removal, there were therefore left in place.   Hemostasis was adequate at all levels.  We  proceeded with the [facility] sutures.  The left uterosacral ligament was sutured then the peritoneum and the middle and the right utero ligament with a Vicryl 2-0.  A second McCall suture was applied the same way but higher up.  Both were attached.  We then started on closing time the vaginal vault.  Vicryl 0 figure-of-eight were used starting out posteriorly in a vertical fashion.  Just before reaching the uterosacral ligaments those were attached together at the midline.  We then finish closure of the vaginal vault with figure of 8 with Vicryl 0 all the way to the anterior aspect.  We then proceeded with a cystocele repair.  The vaginal mucosa was opened just anterior to the vaginal vault closure.  Stroley scissors were used to separate the vaginal mucosa and the vaginal mucosa was section in the midline up to a point about 2 cm from the urethral meatus.  We then used the scalpel to be retraction of the fascia from the vaginal mucosa on each side. With the sponge on the finger we now retracted the fascia with the underlying bladder from the vaginal mucosa.  We then reduced the cystocele with separate stitches of Vicryl 2-0.  We then section the redundant vaginal mucosa on either side. And we closed the vaginal mucosa with a running suture of Vicryl 2-0. Good suspension of the vaginal vault in good reduction of the cystocele were achieved. Hemostasis was adequate at all levels. Packing was done with esterase cream after removing all instruments. The patient was brought to recovery room in good  and stable status.  ESTIMATED BLOOD LOSS:  20 cc   Intake/Output Summary (Last 24 hours) at 07/29/13 4540 Last data filed at 07/29/13 9811  Gross per 24 hour  Intake   1000 ml  Output      0 ml  Net   1000 ml     BLOOD ADMINISTERED:none   LOCAL MEDICATIONS USED:  LIDOCAINE with EPINEPHRINE  SPECIMEN:  Source of Specimen:  Uterus with cervix  DISPOSITION OF SPECIMEN:  PATHOLOGY  COUNTS:  YES  PLAN OF CARE:  Transfer to PACU   Genia Del  MD  07/29/2013 at 9:05 am

## 2013-07-29 NOTE — Preoperative (Signed)
Beta Blockers   Reason not to administer Beta Blockers:Not Applicable 

## 2013-07-29 NOTE — Anesthesia Postprocedure Evaluation (Signed)
Anesthesia Post Note  Patient: Gabrielle Rodriguez  Procedure(s) Performed: Procedure(s) (LRB): HYSTERECTOMY VAGINAL with McCalls (N/A) ANTERIOR REPAIR (CYSTOCELE) (N/A)  Anesthesia type: GA  Patient location: PACU  Post pain: Pain level controlled  Post assessment: Post-op Vital signs reviewed  Last Vitals:  Filed Vitals:   07/29/13 1000  BP: 119/73  Pulse: 66  Temp:   Resp: 14    Post vital signs: Reviewed  Level of consciousness: sedated  Complications: No apparent anesthesia complications

## 2013-07-29 NOTE — Anesthesia Postprocedure Evaluation (Signed)
  Anesthesia Post-op Note  Patient: Gabrielle Rodriguez  Procedure(s) Performed: Procedure(s): HYSTERECTOMY VAGINAL with McCalls (N/A) ANTERIOR REPAIR (CYSTOCELE) (N/A)  Patient Location: Mother/Baby  Anesthesia Type:General  Level of Consciousness: awake  Airway and Oxygen Therapy: Patient Spontanous Breathing  Post-op Pain: mild  Post-op Assessment: Patient's Cardiovascular Status Stable and Respiratory Function Stable  Post-op Vital Signs: stable  Complications: No apparent anesthesia complications

## 2013-07-30 LAB — CBC
MCH: 31 pg (ref 26.0–34.0)
MCHC: 33.4 g/dL (ref 30.0–36.0)
MCV: 92.7 fL (ref 78.0–100.0)
Platelets: 242 10*3/uL (ref 150–400)
RBC: 3.68 MIL/uL — ABNORMAL LOW (ref 3.87–5.11)

## 2013-07-30 MED ORDER — OXYCODONE-ACETAMINOPHEN 7.5-325 MG PO TABS: 1.0000 | ORAL_TABLET | ORAL | Status: DC | PRN

## 2013-07-30 NOTE — Progress Notes (Signed)
D/C VAGINAL PACKING PER ORDER.  MODERATELY SATURATED. PT TOLERATED WELL. WILL CONTINUE TO MONITOR.

## 2013-07-30 NOTE — Discharge Summary (Signed)
  Physician Discharge Summary  Patient ID: Gabrielle Rodriguez MRN: [id number] DOB/AGE: [birth year] 52 y.o.  Admit date: 07/29/2013 Discharge date: 07/30/2013  Admission Diagnoses: Cystocele, Uterine Prolapse  58260, 57240  Discharge Diagnoses: Cystocele, Uterine Prolapse  58260, 57240        Active Problems:   * No active hospital problems. *   Discharged Condition: good  Hospital Course: Good  Consults: None  Treatments: surgery: Total vaginal hysterectomy with McCalls and Cystocele repair.  Disposition:  D/C home      Medication List    ASK your doctor about these medications       chlorpheniramine-pseudoephedrine-acetaminophen 2-30-500 MG per tablet  Commonly known as:  SINUTAB  Take 1 tablet by mouth every 4 (four) hours as needed for allergies or congestion.     ibuprofen 400 MG tablet  Commonly known as:  ADVIL,MOTRIN  Take 400 mg by mouth every 6 (six) hours as needed for pain.     predniSONE 5 MG tablet  Commonly known as:  DELTASONE  Take 5 mg by mouth taper from 4 doses each day to 1 dose and stop.           Follow-up Information   Follow up with Savi Lastinger,MARIE-LYNE, MD In 3 weeks.   Specialty:  Obstetrics and Gynecology   Contact information:   [address] [place] [address] [phone]       Signed: Genia Del, MD 07/30/2013, 10:46 AM

## 2013-07-30 NOTE — Progress Notes (Signed)
Patient able to void a total of 450cc since catheter removal. MD notified.  Discharge instructions provided to patient and significant other at bedside.  Follow up appointments, activity, medications, when to call the doctor and community resources discussed.  No questions at this time.  Patient left unit in stable condition with all personal belongings and prescriptions accompanied by staff.  Osvaldo Angst, RN-----------

## 2013-07-30 NOTE — Progress Notes (Signed)
1 Day Post-Op Procedure(s) (LRB): HYSTERECTOMY VAGINAL with McCalls (N/A) ANTERIOR REPAIR (CYSTOCELE) (N/A)  Subjective: Patient reports that pain is well managed.  Tolerating normal diet as tolerated  diet without difficulty. No nausea / vomiting.  Ambulating and attempting to void.  Objective: BP 111/58  Pulse 79  Temp(Src) 97.8 F (36.6 C) (Oral)  Resp 98  Ht 5\' 6"  (1.676 m)  Wt 61.236 kg (135 lb)  BMI 21.8 kg/m2  SpO2 100%  LMP 07/03/2011 Lungs: clear Heart: normal rate and rhythm Abdomen:soft and appropriately tender Extremities: Homans sign is negative, no sign of DVT Incision: No abdo incision.  Vagina no hematoma, no bleeding.  Postop Hb 11.4              Wbc 9.9   Assessment: s/p Procedure(s): HYSTERECTOMY VAGINAL with McCalls ANTERIOR REPAIR (CYSTOCELE): progressing well Will catheterize bladder.  If no spontaneous miction with second attempt, will d/c with Foley.  Plan: Discharge home  LOS: 1 day    Jayliani Wanner,MARIE-LYNE 07/30/2013, 10:41 AM

## 2013-08-01 ENCOUNTER — Encounter (HOSPITAL_COMMUNITY): Payer: Self-pay | Admitting: Obstetrics & Gynecology

## 2014-08-23 ENCOUNTER — Ambulatory Visit (HOSPITAL_COMMUNITY)
Admission: RE | Admit: 2014-08-23 | Discharge: 2014-08-23 | Disposition: A | Discharge status: Home / Self Care | Payer: BC Managed Care – PPO | Source: Ambulatory Visit | Attending: Family Medicine | Admitting: Family Medicine

## 2014-08-23 ENCOUNTER — Other Ambulatory Visit (HOSPITAL_COMMUNITY): Payer: Self-pay | Admitting: Family Medicine

## 2014-08-23 ENCOUNTER — Encounter: Payer: Self-pay | Admitting: Obstetrics & Gynecology

## 2014-08-23 DIAGNOSIS — M25552 Pain in left hip: Secondary | ICD-10-CM | POA: Diagnosis present

## 2014-08-23 DIAGNOSIS — M25462 Effusion, left knee: Secondary | ICD-10-CM | POA: Diagnosis not present

## 2014-08-23 DIAGNOSIS — M25562 Pain in left knee: Secondary | ICD-10-CM | POA: Diagnosis not present

## 2014-11-06 ENCOUNTER — Other Ambulatory Visit: Payer: Self-pay

## 2014-11-06 ENCOUNTER — Telehealth: Payer: Self-pay

## 2014-11-06 DIAGNOSIS — Z1211 Encounter for screening for malignant neoplasm of colon: Secondary | ICD-10-CM

## 2014-11-06 MED ORDER — PEG-KCL-NACL-NASULF-NA ASC-C 100 G PO SOLR
1.0000 | ORAL | Status: DC
Start: 2014-11-06 — End: 2014-11-20

## 2014-11-06 NOTE — Telephone Encounter (Signed)
Gastroenterology Pre-Procedure Review  Request Date: 11/06/2014 Requesting Physician: Dr. Hassan RowanKoberlein  PATIENT REVIEW QUESTIONS: The patient responded to the following health history questions as indicated:    1. Diabetes Melitis: no 2. Joint replacements in the past 12 months: no 3. Major health problems in the past 3 months: no 4. Has an artificial valve or MVP: no 5. Has a defibrillator: no 6. Has been advised in past to take antibiotics in advance of a procedure like teeth cleaning: no    MEDICATIONS & ALLERGIES:    Patient reports the following regarding taking any blood thinners:   Plavix? no Aspirin? no Coumadin? no  Patient confirms/reports the following medications:  Current Outpatient Prescriptions  Medication Sig Dispense Refill  . ibuprofen (ADVIL,MOTRIN) 400 MG tablet Take 400 mg by mouth every 6 (six) hours as needed for pain.    . chlorpheniramine-pseudoephedrine-acetaminophen (SINUTAB) 2-30-500 MG per tablet Take 1 tablet by mouth every 4 (four) hours as needed for allergies or congestion.    Marland Kitchen. oxyCODONE-acetaminophen (PERCOCET) 7.5-325 MG per tablet Take 1 tablet by mouth every 4 (four) hours as needed for pain. (Patient not taking: Reported on 11/06/2014) 30 tablet 0   No current facility-administered medications for this visit.    Patient confirms/reports the following allergies:  No Known Allergies  No orders of the defined types were placed in this encounter.    AUTHORIZATION INFORMATION Primary Insurance:   ID #:   Group #:  Pre-Cert / Auth required:  Pre-Cert / Auth #:   Secondary Insurance:   ID #:   Group #:  Pre-Cert / Auth required:  Pre-Cert / Auth #:   SCHEDULE INFORMATION: Procedure has been scheduled as follows:  Date: 11/14/2014              Time: 1:30 PM  [facility] Short Stay  This Gastroenterology Pre-Precedure Review Form is being routed to the following provider(s): Jonette EvaSandi Fields, MD

## 2014-11-06 NOTE — Telephone Encounter (Signed)
Pt had to be rescheduled to 11/20/2014 at 11:30 Am with Dr. Darrick PennaFields.

## 2014-11-06 NOTE — Telephone Encounter (Signed)
Appropriate.

## 2014-11-06 NOTE — Telephone Encounter (Signed)
I called [employer] [place] at [phone] and just talked to the automated machine.  PA is not required for the screening colonoscopy. Reference # is [id number].

## 2014-11-06 NOTE — Telephone Encounter (Signed)
Rx sent to the pharmacy and instructions mailed to pt.  

## 2014-11-20 ENCOUNTER — Ambulatory Visit (HOSPITAL_COMMUNITY)
Admission: RE | Admit: 2014-11-20 | Discharge: 2014-11-20 | Disposition: A | Discharge status: Home / Self Care | Payer: BLUE CROSS/BLUE SHIELD | Source: Ambulatory Visit | Attending: Gastroenterology | Admitting: Gastroenterology

## 2014-11-20 ENCOUNTER — Encounter (HOSPITAL_COMMUNITY)
Admission: RE | Disposition: A | Discharge status: Home / Self Care | Payer: Self-pay | Source: Ambulatory Visit | Attending: Gastroenterology

## 2014-11-20 ENCOUNTER — Encounter (HOSPITAL_COMMUNITY): Payer: Self-pay | Admitting: *Deleted

## 2014-11-20 DIAGNOSIS — Z791 Long term (current) use of non-steroidal anti-inflammatories (NSAID): Secondary | ICD-10-CM | POA: Insufficient documentation

## 2014-11-20 DIAGNOSIS — Z1211 Encounter for screening for malignant neoplasm of colon: Secondary | ICD-10-CM | POA: Insufficient documentation

## 2014-11-20 DIAGNOSIS — K5730 Diverticulosis of large intestine without perforation or abscess without bleeding: Secondary | ICD-10-CM | POA: Diagnosis not present

## 2014-11-20 DIAGNOSIS — Z79891 Long term (current) use of opiate analgesic: Secondary | ICD-10-CM | POA: Insufficient documentation

## 2014-11-20 DIAGNOSIS — K648 Other hemorrhoids: Secondary | ICD-10-CM | POA: Insufficient documentation

## 2014-11-20 DIAGNOSIS — Q438 Other specified congenital malformations of intestine: Secondary | ICD-10-CM | POA: Diagnosis not present

## 2014-11-20 HISTORY — PX: COLONOSCOPY: SHX5424

## 2014-11-20 SURGERY — COLONOSCOPY
Anesthesia: Moderate Sedation

## 2014-11-20 MED ORDER — MEPERIDINE HCL 100 MG/ML IJ SOLN
INTRAMUSCULAR | Status: AC
  Filled 2014-11-20: qty 2

## 2014-11-20 MED ORDER — MIDAZOLAM HCL 5 MG/5ML IJ SOLN
INTRAMUSCULAR | Status: DC | PRN
  Administered 2014-11-20 (×2): 2 mg via INTRAVENOUS
  Administered 2014-11-20: 1 mg via INTRAVENOUS
  Administered 2014-11-20: 2 mg via INTRAVENOUS

## 2014-11-20 MED ORDER — MIDAZOLAM HCL 5 MG/5ML IJ SOLN
INTRAMUSCULAR | Status: AC
  Filled 2014-11-20: qty 10

## 2014-11-20 MED ORDER — STERILE WATER FOR IRRIGATION IR SOLN
Status: DC | PRN
  Administered 2014-11-20: 09:00:00

## 2014-11-20 MED ORDER — MEPERIDINE HCL 100 MG/ML IJ SOLN
INTRAMUSCULAR | Status: DC | PRN
  Administered 2014-11-20 (×3): 25 mg via INTRAVENOUS

## 2014-11-20 MED ORDER — SODIUM CHLORIDE 0.9 % IV SOLN
INTRAVENOUS | Status: DC
  Administered 2014-11-20: 1000 mL via INTRAVENOUS

## 2014-11-20 NOTE — Discharge Instructions (Signed)
You have small internal hemorrhoids and diverticulosis IN YOUR LEFT AND RIGHT COLON.   Follow a HIGH FIBER DIET. AVOID ITEMS THAT CAUSE BLOATING. See info below.  Next colonoscopy in 10 years.  Colonoscopy Care After Read the instructions outlined below and refer to this sheet in the next week. These discharge instructions provide you with general information on caring for yourself after you leave the hospital. While your treatment has been planned according to the most current medical practices available, unavoidable complications occasionally occur. If you have any problems or questions after discharge, call DR. Adger Cantera, [phone].  ACTIVITY  You may resume your regular activity, but move at a slower pace for the next 24 hours.   Take frequent rest periods for the next 24 hours.   Walking will help get rid of the air and reduce the bloated feeling in your belly (abdomen).   No driving for 24 hours (because of the medicine (anesthesia) used during the test).   You may shower.   Do not sign any important legal documents or operate any machinery for 24 hours (because of the anesthesia used during the test).    NUTRITION  Drink plenty of fluids.   You may resume your normal diet as instructed by your doctor.   Begin with a light meal and progress to your normal diet. Heavy or fried foods are harder to digest and may make you feel sick to your stomach (nauseated).   Avoid alcoholic beverages for 24 hours or as instructed.    MEDICATIONS  You may resume your normal medications.   WHAT YOU CAN EXPECT TODAY  Some feelings of bloating in the abdomen.   Passage of more gas than usual.   Spotting of blood in your stool or on the toilet paper  .  IF YOU HAD POLYPS REMOVED DURING THE COLONOSCOPY:  Eat a soft diet IF YOU HAVE NAUSEA, BLOATING, ABDOMINAL PAIN, OR VOMITING.    FINDING OUT THE RESULTS OF YOUR TEST Not all test results are available during your visit. DR.  Darrick Penna WILL CALL YOU WITHIN 7 DAYS OF YOUR PROCEDUE WITH YOUR RESULTS. Do not assume everything is normal if you have not heard from DR. Hosteen Kienast IN ONE WEEK, CALL HER OFFICE AT [phone].  SEEK IMMEDIATE MEDICAL ATTENTION AND CALL THE OFFICE: [phone] IF:  You have more than a spotting of blood in your stool.   Your belly is swollen (abdominal distention).   You are nauseated or vomiting.   You have a temperature over 101F.   You have abdominal pain or discomfort that is severe or gets worse throughout the day.  High-Fiber Diet A high-fiber diet changes your normal diet to include more whole grains, legumes, fruits, and vegetables. Changes in the diet involve replacing refined carbohydrates with unrefined foods. The calorie level of the diet is essentially unchanged. The Dietary Reference Intake (recommended amount) for adult males is 38 grams per day. For adult females, it is 25 grams per day. Pregnant and lactating women should consume 28 grams of fiber per day. Fiber is the intact part of a plant that is not broken down during digestion. Functional fiber is fiber that has been isolated from the plant to provide a beneficial effect in the body. PURPOSE  Increase stool bulk.   Ease and regulate bowel movements.   Lower cholesterol.  INDICATIONS THAT YOU NEED MORE FIBER  Constipation and hemorrhoids.   Uncomplicated diverticulosis (intestine condition) and irritable bowel syndrome.   Weight management.  As a protective measure against hardening of the arteries (atherosclerosis), diabetes, and cancer.   GUIDELINES FOR INCREASING FIBER IN THE DIET  Start adding fiber to the diet slowly. A gradual increase of about 5 more grams (2 slices of whole-wheat bread, 2 servings of most fruits or vegetables, or 1 bowl of high-fiber cereal) per day is best. Too rapid an increase in fiber may result in constipation, flatulence, and bloating.   Drink enough water and fluids to keep your  urine clear or pale yellow. Water, juice, or caffeine-free drinks are recommended. Not drinking enough fluid may cause constipation.   Eat a variety of high-fiber foods rather than one type of fiber.   Try to increase your intake of fiber through using high-fiber foods rather than fiber pills or supplements that contain small amounts of fiber.   The goal is to change the types of food eaten. Do not supplement your present diet with high-fiber foods, but replace foods in your present diet.  INCLUDE A VARIETY OF FIBER SOURCES  Replace refined and processed grains with whole grains, canned fruits with fresh fruits, and incorporate other fiber sources. White rice, white breads, and most bakery goods contain little or no fiber.   Brown whole-grain rice, buckwheat oats, and many fruits and vegetables are all good sources of fiber. These include: broccoli, Brussels sprouts, cabbage, cauliflower, beets, sweet potatoes, white potatoes (skin on), carrots, tomatoes, eggplant, squash, berries, fresh fruits, and dried fruits.   Cereals appear to be the richest source of fiber. Cereal fiber is found in whole grains and bran. Bran is the fiber-rich outer coat of cereal grain, which is largely removed in refining. In whole-grain cereals, the bran remains. In breakfast cereals, the largest amount of fiber is found in those with "bran" in their names. The fiber content is sometimes indicated on the label.   You may need to include additional fruits and vegetables each day.   In baking, for 1 cup white flour, you may use the following substitutions:   1 cup whole-wheat flour minus 2 tablespoons.   1/2 cup white flour plus 1/2 cup whole-wheat flour.   Diverticulosis Diverticulosis is a common condition that develops when small pouches (diverticula) form in the wall of the colon. The risk of diverticulosis increases with age. It happens more often in people who eat a low-fiber diet. Most individuals with  diverticulosis have no symptoms. Those individuals with symptoms usually experience belly (abdominal) pain, constipation, or loose stools (diarrhea).  HOME CARE INSTRUCTIONS  Increase the amount of fiber in your diet as directed by your caregiver or dietician. This may reduce symptoms of diverticulosis.   Drink at least 6 to 8 glasses of water each day to prevent constipation.   Try not to strain when you have a bowel movement.   Avoiding nuts and seeds to prevent complications is still an uncertain benefit.       FOODS HAVING HIGH FIBER CONTENT INCLUDE:  Fruits. Apple, peach, pear, tangerine, raisins, prunes.   Vegetables. Brussels sprouts, asparagus, broccoli, cabbage, carrot, cauliflower, romaine lettuce, spinach, summer squash, tomato, winter squash, zucchini.   Starchy Vegetables. Baked beans, kidney beans, lima beans, split peas, lentils, potatoes (with skin).   Grains. Whole wheat bread, brown rice, bran flake cereal, plain oatmeal, white rice, shredded wheat, bran muffins.    SEEK IMMEDIATE MEDICAL CARE IF:  You develop increasing pain or severe bloating.   You have an oral temperature above 101F.   You develop vomiting or  bowel movements that are bloody or black.   Hemorrhoids Hemorrhoids are dilated (enlarged) veins around the rectum. Sometimes clots will form in the veins. This makes them swollen and painful. These are called thrombosed hemorrhoids. Causes of hemorrhoids include:  Constipation.   Straining to have a bowel movement.   HEAVY LIFTING HOME CARE INSTRUCTIONS  Eat a well balanced diet and drink 6 to 8 glasses of water every day to avoid constipation. You may also use a bulk laxative.   Avoid straining to have bowel movements.   Keep anal area dry and clean.   Do not use a donut shaped pillow or sit on the toilet for long periods. This increases blood pooling and pain.   Move your bowels when your body has the urge; this will require less  straining and will decrease pain and pressure.

## 2014-11-20 NOTE — H&P (Signed)
  Primary Care Physician:  Cristal Ford, MD Primary Gastroenterologist:  Dr. Oneida Alar  Pre-Procedure History & Physical: HPI:  Gabrielle Rodriguez is a 54 y.o. female here for [place].  Past Medical History  Diagnosis Date  . SVD (spontaneous vaginal delivery)     x 1   Past Surgical History  Procedure Laterality Date  . Eye surgery      eye surgery at age 42 yrs  . Cesarean section      x 1  . Wisdom tooth extraction    . Vaginal hysterectomy N/A 07/29/2013    Procedure: HYSTERECTOMY VAGINAL with McCalls;  Surgeon: Princess Bruins, MD;  Location: [facility];  Service: Gynecology;  Laterality: N/A;  . Cystocele repair N/A 07/29/2013    Procedure: ANTERIOR REPAIR (CYSTOCELE);  Surgeon: Princess Bruins, MD;  Location: [facility];  Service: Gynecology;  Laterality: N/A;    Prior to Admission medications   Medication Sig Start Date End Date Taking? Authorizing Provider  chlorpheniramine-pseudoephedrine-acetaminophen (SINUTAB) 2-30-500 MG per tablet Take 1 tablet by mouth every 4 (four) hours as needed for allergies or congestion.   Yes Historical Provider, MD  ibuprofen (ADVIL,MOTRIN) 400 MG tablet Take 400 mg by mouth every 6 (six) hours as needed for pain.   Yes Historical Provider, MD  peg 3350 powder (MOVIPREP) 100 G SOLR Take 1 kit (200 g total) by mouth as directed. 11/06/14  Yes Danie Binder, MD  oxyCODONE-acetaminophen (PERCOCET) 7.5-325 MG per tablet Take 1 tablet by mouth every 4 (four) hours as needed for pain. Patient not taking: Reported on 11/06/2014 07/30/13   Princess Bruins, MD    Allergies as of 11/06/2014  . (No Known Allergies)    History reviewed. No pertinent family history.  History   Social History  . Marital Status: Married    Spouse Name: N/A    Number of Children: N/A  . Years of Education: N/A   Occupational History  . Not on file.   Social History Main Topics  . Smoking status: Never Smoker   . Smokeless tobacco: Never Used   . Alcohol Use: Yes     Comment: socially  . Drug Use: No  . Sexual Activity: Yes    Birth Control/ Protection: Post-menopausal   Other Topics Concern  . Not on file   Social History Narrative    Review of Systems: See HPI, otherwise negative ROS   Physical Exam: BP 129/75 mmHg  Pulse 55  Temp(Src) 97.6 F (36.4 C) (Oral)  Resp 16  Ht $R'5\' 6"'eO$  (1.676 m)  Wt 144 lb (65.318 kg)  BMI 23.25 kg/m2  SpO2 97%  LMP 07/03/2011 General:   Alert,  pleasant and cooperative in NAD Head:  Normocephalic and atraumatic. Neck:  Supple; Lungs:  Clear throughout to auscultation.    Heart:  Regular rate and rhythm. Abdomen:  Soft, nontender and nondistended. Normal bowel sounds, without guarding, and without rebound.   Neurologic:  Alert and  oriented x4;  grossly normal neurologically.  Impression/Plan:     SCREENING  Plan:  1. TCS TODAY

## 2014-11-21 ENCOUNTER — Encounter (HOSPITAL_COMMUNITY): Payer: Self-pay | Admitting: Gastroenterology

## 2014-11-21 NOTE — Op Note (Signed)
NAMOren Bracket:  Levandowski, Blandina               ACCOUNT NO.:  [id number]  MEDICAL RECORD NO.:  [id number]  LOCATION:  [facility]                          FACILITY:  [facility]  PHYSICIAN:  Jonette EvaSandi Montee Tallman, M.D.     DATE OF BIRTH:  11-22-1960  DATE OF PROCEDURE: DATE OF DISCHARGE:  11/20/2014                              OPERATIVE REPORT   REFERRED BY:  Laverle HobbyJoseph Guarino, M.D.  PROCEDURE:  Screening colonoscopy.  INDICATION:  Average risk colon cancer screening.  FINDINGS: 1. Normal ileum. 2. Pancolonic diverticulosis with angulated rectosigmoid junction and     muscular hypertrophy. 3. Redundant left colon. 4. Moderate internal hemorrhoids.  RECOMMENDATIONS: 1. Follow a high-fiber diet.  Avoid items that cause bloating.Handout given. 2. Colonoscopy in 10 years.  Consider overtube.  MEDICATIONS: 1. Demerol 75 mg IV. 2. Versed 7 mg IV.  COMPLICATIONS:  None.  PROCEDURE TECHNIQUE:  Physical exam was performed and informed consent was obtained from the patient after explaining the benefits, risks, and alternatives to the procedure.  The patient was connected to the monitor and placed in a left lateral position.  Continuous oxygen was provided by nasal cannula and IV medicine administered through an indwelling cannula. After administration of sedation and rectal exam, the patient's rectum was intubated and the scope was advanced under direct visualization to the ileum.  The scope was removed slowly by careful examining the color, texture, anatomy, and integrating the mucosa from the layout.  The patient was recovered in endoscopy and discharged home in satisfactory condition.    Jonette EvaSandi Ryszard Socarras, M.D.    SF/MEDQ  D:  11/20/2014  T:  11/20/2014  Job:  [id number]

## 2015-05-10 ENCOUNTER — Encounter: Payer: Self-pay | Admitting: Orthopedic Surgery

## 2015-05-10 ENCOUNTER — Ambulatory Visit (INDEPENDENT_AMBULATORY_CARE_PROVIDER_SITE_OTHER): Payer: BLUE CROSS/BLUE SHIELD | Admitting: Orthopedic Surgery

## 2015-05-10 ENCOUNTER — Ambulatory Visit (INDEPENDENT_AMBULATORY_CARE_PROVIDER_SITE_OTHER): Payer: BLUE CROSS/BLUE SHIELD

## 2015-05-10 VITALS — BP 145/88 | Ht 66.0 in | Wt 149.0 lb

## 2015-05-10 DIAGNOSIS — M5442 Lumbago with sciatica, left side: Secondary | ICD-10-CM | POA: Diagnosis not present

## 2015-05-10 DIAGNOSIS — M25462 Effusion, left knee: Secondary | ICD-10-CM | POA: Diagnosis not present

## 2015-05-10 DIAGNOSIS — M25562 Pain in left knee: Secondary | ICD-10-CM

## 2015-05-10 NOTE — Progress Notes (Signed)
Patient ID: Gabrielle Rodriguez, female   DOB: 15-Sep-1961, 54 y.o.   MRN: [id number] NEW    Chief Complaint  Patient presents with  . Knee Pain    Left knee pain, pain started 2 weeks ago.     Gabrielle Rodriguez is a 53 y.o. female.   HPI Today we have a 54 year old female who is in a golf tournament about 2 weeks ago and walk to drain of the golf tournament and then noticed pain and swelling in her left knee. She did have some weakness in her left knee with lack of confidence and a feeling of it may give way for about a year and half. Her symptoms have increased over the last 2 weeks after the golf tournament. She is also having some lateral pain radiating to her left hip which she had some time ago and subsequently had no symptoms until recently while limping on her left leg  She does report some catching and locking with extension again we note the giving way. The pain is constant. She was treated with ibuprofen and ice which helped a lot but the pain and swelling increase by the end of the day.  Review of systems is related only positively in the musculoskeletal system with joint pain limb pain weakness gait disturbance stiff joint and swollen joint   Review of Systems See hpi  Past Medical History  Diagnosis Date  . SVD (spontaneous vaginal delivery)     x 1    Past Surgical History  Procedure Laterality Date  . Eye surgery      eye surgery at age 7 yrs  . Cesarean section      x 1  . Wisdom tooth extraction    . Vaginal hysterectomy N/A 07/29/2013    Procedure: HYSTERECTOMY VAGINAL with McCalls;  Surgeon: Genia Del, MD;  Location: [facility];  Service: Gynecology;  Laterality: N/A;  . Cystocele repair N/A 07/29/2013    Procedure: ANTERIOR REPAIR (CYSTOCELE);  Surgeon: Genia Del, MD;  Location: [facility];  Service: Gynecology;  Laterality: N/A;  . Colonoscopy N/A 11/20/2014    Procedure: COLONOSCOPY;  Surgeon: West Bali, MD;  Location: [facility];  Service:  Endoscopy;  Laterality: N/A;  1:30 PM - moved to 1/11 @ 11:30 - Doris to notify pt    No family history on file.  Social History History  Substance Use Topics  . Smoking status: Never Smoker   . Smokeless tobacco: Never Used  . Alcohol Use: Yes     Comment: socially    No Known Allergies  Current Outpatient Prescriptions  Medication Sig Dispense Refill  . ATORVASTATIN CALCIUM PO Take by mouth.    Marland Kitchen ibuprofen (ADVIL,MOTRIN) 400 MG tablet Take 400 mg by mouth every 6 (six) hours as needed for pain.    . chlorpheniramine-pseudoephedrine-acetaminophen (SINUTAB) 2-30-500 MG per tablet Take 1 tablet by mouth every 4 (four) hours as needed for allergies or congestion.    Marland Kitchen oxyCODONE-acetaminophen (PERCOCET) 7.5-325 MG per tablet Take 1 tablet by mouth every 4 (four) hours as needed for pain. (Patient not taking: Reported on 11/06/2014) 30 tablet 0   No current facility-administered medications for this visit.       Physical Exam Blood pressure 145/88, height  (1.676 m), weight 149 lb (67.586 kg), last menstrual period 07/03/2011. Physical Exam The patient is well developed well nourished and well groomed. Orientation to person place and time is normal  Mood is pleasant. Ambulatory status she  does not require support for walking she does have a limp. The right knee is noted to have full range of motion no swelling. It is not subluxated and muscle tone is normal  Left knee medial joint line tenderness large effusion knee flexion is still 120 collateral ligaments and cruciate ligaments are stable, motor exam muscle tone are normal. She has good pulses in both feet skin is normal.    Data Reviewed We ordered and lumbar spine film we are to have a knee film although was before the injury  I interpret the back film is coronal plane malalignment without evidence of degenerative disc disease  The knee x-ray showed mild effusion and mild arthritis I reviewed the film of the knee  which was taken at the hospital  Assessment Encounter Diagnoses  Name Primary?  . Left-sided low back pain with left-sided sciatica   . Effusion of knee joint, left Yes   I believe the spine pain to be reaction to the limping of the left knee as it started after that.  I aspirated the left knee 25 mL of clear yellow fluid  Injected cortisone 40 mg  Knee exercises continue ibuprofen and ice return in about one week and if no improvement then it would be prudent to get an MRI for torn medial meniscus   Procedure note injection and aspiration left knee joint  Verbal consent was obtained to aspirate and inject the left knee joint   Timeout was completed to confirm the site of aspiration and injection  An 18-gauge needle was used to aspirate the left knee joint from a suprapatellar lateral approach.  The medications used were 40 mg of Depo-Medrol and 1% lidocaine 3 cc  Anesthesia was provided by ethyl chloride and the skin was prepped with alcohol.  After cleaning the skin with alcohol an 18-gauge needle was used to aspirate the right knee joint.  We obtained 25 cc of fluid  We follow this by injection of 40 mg of Depo-Medrol and 3 cc 1% lidocaine.  There were no complications. A sterile bandage was applied.  Plan SEE ABOVE

## 2015-05-10 NOTE — Patient Instructions (Addendum)
Home exercises   Joint Injection Care After Refer to this sheet in the next few days. These instructions provide you with information on caring for yourself after you have had a joint injection. Your caregiver also may give you more specific instructions. Your treatment has been planned according to current medical practices, but problems sometimes occur. Call your caregiver if you have any problems or questions after your procedure. After any type of joint injection, it is not uncommon to experience:  Soreness, swelling, or bruising around the injection site.  Mild numbness, tingling, or weakness around the injection site caused by the numbing medicine used before or with the injection. It also is possible to experience the following effects associated with the specific agent after injection:  Iodine-based contrast agents:  Allergic reaction (itching, hives, widespread redness, and swelling beyond the injection site).  Corticosteroids (These effects are rare.):  Allergic reaction.  Increased blood sugar levels (If you have diabetes and you notice that your blood sugar levels have increased, notify your caregiver).  Increased blood pressure levels.  Mood swings.  Hyaluronic acid in the use of viscosupplementation.  Temporary heat or redness.  Temporary rash and itching.  Increased fluid accumulation in the injected joint. These effects all should resolve within a day after your procedure.  HOME CARE INSTRUCTIONS  Limit yourself to light activity the day of your procedure. Avoid lifting heavy objects, bending, stooping, or twisting.  Take prescription or over-the-counter pain medication as directed by your caregiver.  You may apply ice to your injection site to reduce pain and swelling the day of your procedure. Ice may be applied 03-04 times:  Put ice in a plastic bag.  Place a towel between your skin and the bag.  Leave the ice on for no longer than 15-20 minutes each  time. SEEK IMMEDIATE MEDICAL CARE IF:   Pain and swelling get worse rather than better or extend beyond the injection site.  Numbness does not go away.  Blood or fluid continues to leak from the injection site.  You have chest pain.  You have swelling of your face or tongue.  You have trouble breathing or you become dizzy.  You develop a fever, chills, or severe tenderness at the injection site that last longer than 1 day. MAKE SURE YOU:  Understand these instructions.  Watch your condition.  Get help right away if you are not doing well or if you get worse. Document Released: 07/10/2011 Document Revised: 01/19/2012 Document Reviewed: 07/10/2011 [facility] Patient Information 2015 [employer] [place]. This information is not intended to replace advice given to you by your health care provider. Make sure you discuss any questions you have with your health care provider.    Thank you for choosing [facility]. We noticed that you were late for your appointment today. While we know that several things can lead to running late we asked that you would please try to be on time so that we can stay on schedule and see all the patient's that need to be seen. Please call us and let us know if you are running late. We also ask that you would try to be early to fill out all the necessary and required paperwork and forms so that the doctor can see you at your scheduled time. We thank you for your compliance in this matter.

## 2015-05-17 ENCOUNTER — Encounter: Payer: Self-pay | Admitting: Orthopedic Surgery

## 2015-05-17 ENCOUNTER — Ambulatory Visit (INDEPENDENT_AMBULATORY_CARE_PROVIDER_SITE_OTHER): Payer: BLUE CROSS/BLUE SHIELD | Admitting: Orthopedic Surgery

## 2015-05-17 VITALS — BP 122/83 | Ht 66.0 in | Wt 149.0 lb

## 2015-05-17 DIAGNOSIS — M25462 Effusion, left knee: Secondary | ICD-10-CM | POA: Diagnosis not present

## 2015-05-17 DIAGNOSIS — M23322 Other meniscus derangements, posterior horn of medial meniscus, left knee: Secondary | ICD-10-CM

## 2015-05-17 NOTE — Progress Notes (Signed)
Patient ID: Gabrielle HolesJulia C Brunsman, female   DOB: Feb 11, 1961, 54 y.o.   MRN: [id number]  Follow up visit  Chief Complaint  Patient presents with  . Follow-up    6 day follow up left knee s/p injection and home exercise    BP 122/83 mmHg  Ht 5\' 6"  (1.676 m)  Wt 149 lb (67.586 kg)  BMI 24.06 kg/m2  LMP 07/03/2011  Encounter Diagnoses  Name Primary?  . Effusion of knee joint, left Yes  . Medial meniscus, posterior horn derangement, left     Recheck left knee status post aspiration injection. Patient says that gave her significant relief although the knee still pops. She still lacks confidence in her left knee. She's had it for several years.  Reexamination no catching locking giving way but she says she just doesn't feel like she could "take off and run".  Reexamination reveals that she has tenderness along the medial joint line but the effusion is gone she has full range of motion in her knee is stable McMurray sign is positive however. Her motor exam is normal scans intact and her sensation is normal  Impression torn medial meniscus with joint effusion which has resolved  Patient will decide when she wants surgery  The surgery details have already been discussed  Surgical procedure arthroscopy left knee partial medial meniscectomy

## 2015-05-17 NOTE — Patient Instructions (Signed)
Call us when you decide to move forward with surgery (KNEE ARTHROSCOPY WITH MEDIAL MENISECTOMY)

## 2015-09-03 ENCOUNTER — Telehealth: Payer: Self-pay | Admitting: Orthopedic Surgery

## 2015-09-03 NOTE — Telephone Encounter (Signed)
Patient is wanting to go ahead and try to scheduled knee surgery, she does have some questions, please advise?

## 2015-09-04 NOTE — Telephone Encounter (Signed)
Last ov 05/17/15, will she need to be seen here for pre op before scheduling?

## 2015-09-05 NOTE — Telephone Encounter (Signed)
Left a voicemail for a returned call

## 2015-09-05 NOTE — Telephone Encounter (Signed)
Come in for preop

## 2015-09-05 NOTE — Telephone Encounter (Signed)
Please call patient and schedule pre op in office

## 2015-09-06 NOTE — Telephone Encounter (Signed)
[place]

## 2015-09-13 NOTE — Telephone Encounter (Signed)
As of 09/11/15, patient returned call, and appointment has been scheduled.

## 2015-09-17 ENCOUNTER — Encounter: Payer: Self-pay | Admitting: Orthopedic Surgery

## 2015-09-17 ENCOUNTER — Ambulatory Visit (INDEPENDENT_AMBULATORY_CARE_PROVIDER_SITE_OTHER): Payer: BLUE CROSS/BLUE SHIELD | Admitting: Orthopedic Surgery

## 2015-09-17 ENCOUNTER — Telehealth: Payer: Self-pay | Admitting: Orthopedic Surgery

## 2015-09-17 VITALS — BP 134/82 | Ht 66.0 in | Wt 149.0 lb

## 2015-09-17 DIAGNOSIS — M23322 Other meniscus derangements, posterior horn of medial meniscus, left knee: Secondary | ICD-10-CM | POA: Diagnosis not present

## 2015-09-17 NOTE — H&P (Signed)
Chief Complaint  Patient presents with  . Follow-up    Recheck on left knee and discuss surgery.    Knee Pain: Patient presents  For reevaluation of her left knee. She had been seen in the past for aspiration injection, she had an MRI back in 2012 which showed she had a medial meniscal tear. She did well with aspiration injection and was doing okay but notes that she can't perform her normal activities. She likes to golf. She also slipped again and reinjured the knee. She wishes to have left knee surgery with arthroscopic meniscectomy. She lacks confidence in the left knee the knee pops often. She says she doesn't feel like she could take often run. Most of her pain is medially the swelling and effusion have been quite hasn't.  Review of systems she's healthy well-developed well-nourished no complaints on review of systems complete system was reviewed including but not limited to she does not have any fever she does not have a numbness or tingling no chest pain shortness of breath cough problems with her eyes ears nose or throat no genitourinary or skin problems.  Past Medical History  Diagnosis Date  . SVD (spontaneous vaginal delivery)     x 1   Past Surgical History  Procedure Laterality Date  . Eye surgery      eye surgery at age 1 yrs  . Cesarean section      x 1  . Wisdom tooth extraction    . Vaginal hysterectomy N/A 07/29/2013    Procedure: HYSTERECTOMY VAGINAL with McCalls;  Surgeon: Marie-Lyne Lavoie, MD;  Location: [facility];  Service: Gynecology;  Laterality: N/A;  . Cystocele repair N/A 07/29/2013    Procedure: ANTERIOR REPAIR (CYSTOCELE);  Surgeon: Marie-Lyne Lavoie, MD;  Location: [facility];  Service: Gynecology;  Laterality: N/A;  . Colonoscopy N/A 11/20/2014    Procedure: COLONOSCOPY;  Surgeon: Sandi L Fields, MD;  [facility]: [facility];  Service: Endoscopy;  Laterality: N/A;  1:30 PM - moved to 1/11 @ 11:30 - Doris to notify pt    Family history none or no problems  reported  Social History  Substance Use Topics  . Smoking status: Never Smoker   . Smokeless tobacco: Never Used  . Alcohol Use: Yes     Comment: socially     Current outpatient prescriptions:  .  ATORVASTATIN CALCIUM PO, Take by mouth., Disp: , Rfl:  .  chlorpheniramine-pseudoephedrine-acetaminophen (SINUTAB) 2-30-500 MG per tablet, Take 1 tablet by mouth every 4 (four) hours as needed for allergies or congestion., Disp: , Rfl:  .  ibuprofen (ADVIL,MOTRIN) 400 MG tablet, Take 400 mg by mouth every 6 (six) hours as needed for pain., Disp: , Rfl:    BP 134/82 mmHg  Ht 5' 6" (1.676 m)  Wt 149 lb (67.586 kg)  BMI 24.06 kg/m2  LMP 07/03/2011   Gen. Appearance is normal thin  Ectomorphic no deformities  oriented 3  Pleasant mood  Normal ambulatory status with no assistive devices and no limp   Left knee medial joint line tenderness small effusion normal alignment  Full range of motion  Anterior and posterior drawer test normal collateral ligament stable  Quadriceps muscle tone normal no atrophy   McMurray sign positive for medial meniscal tear   skin normal   Sensation normal   Vascular status of the limb normal 2+ pulses normal color and temperature no peripheral edema    Her MRI showed a torn medial meniscus back in 2012 or plain films   show very minimal degenerative changes   Diagnosis torn medial meniscus left knee    Plan arthroscopy left knee partial medial meniscectomy    You have decided to proceed with operative arthroscopy of the knee. You have decided not to continue with nonoperative measures such as but not limited to oral medication, weight loss, activity modification, physical therapy, bracing, or injection.  We will perform operative arthroscopy of the knee. Some of the risks associated with arthroscopic surgery of the knee include but are not limited to Bleeding Infection Swelling Stiffness Blood clot Pain  If you're not comfortable with these  risks and would like to continue with nonoperative treatment please let Dr. Merrianne Mccumbers know prior to your surgery.  

## 2015-09-17 NOTE — Progress Notes (Signed)
Chief Complaint  Patient presents with  . Follow-up    Recheck on left knee and discuss surgery.    Knee Pain: Patient presents  For reevaluation of her left knee. She had been seen in the past for aspiration injection, she had an MRI back in 2012 which showed she had a medial meniscal tear. She did well with aspiration injection and was doing okay but notes that she can't perform her normal activities. She likes to golf. She also slipped again and reinjured the knee. She wishes to have left knee surgery with arthroscopic meniscectomy. She lacks confidence in the left knee the knee pops often. She says she doesn't feel like she could take often run. Most of her pain is medially the swelling and effusion have been quite hasn't.  Review of systems she's healthy well-developed well-nourished no complaints on review of systems complete system was reviewed including but not limited to she does not have any fever she does not have a numbness or tingling no chest pain shortness of breath cough problems with her eyes ears nose or throat no genitourinary or skin problems.  Past Medical History  Diagnosis Date  . SVD (spontaneous vaginal delivery)     x 1   Past Surgical History  Procedure Laterality Date  . Eye surgery      eye surgery at age 36 yrs  . Cesarean section      x 1  . Wisdom tooth extraction    . Vaginal hysterectomy N/A 07/29/2013    Procedure: HYSTERECTOMY VAGINAL with McCalls;  Surgeon: Genia DelMarie-Lyne Lavoie, MD;  Location: [facility];  Service: Gynecology;  Laterality: N/A;  . Cystocele repair N/A 07/29/2013    Procedure: ANTERIOR REPAIR (CYSTOCELE);  Surgeon: Genia DelMarie-Lyne Lavoie, MD;  Location: [facility];  Service: Gynecology;  Laterality: N/A;  . Colonoscopy N/A 11/20/2014    Procedure: COLONOSCOPY;  Surgeon: West BaliSandi L Fields, MD;  Location[facility];  Service: Endoscopy;  Laterality: N/A;  1:30 PM - moved to 1/11 @ 11:30 - Doris to notify pt    Family history none or no problems  reported  Social History  Substance Use Topics  . Smoking status: Never Smoker   . Smokeless tobacco: Never Used  . Alcohol Use: Yes     Comment: socially     Current outpatient prescriptions:  .  ATORVASTATIN CALCIUM PO, Take by mouth., Disp: , Rfl:  .  chlorpheniramine-pseudoephedrine-acetaminophen (SINUTAB) 2-30-500 MG per tablet, Take 1 tablet by mouth every 4 (four) hours as needed for allergies or congestion., Disp: , Rfl:  .  ibuprofen (ADVIL,MOTRIN) 400 MG tablet, Take 400 mg by mouth every 6 (six) hours as needed for pain., Disp: , Rfl:    BP 134/82 mmHg  Ht 5\' 6"  (1.676 m)  Wt 149 lb (67.586 kg)  BMI 24.06 kg/m2  LMP 07/03/2011   Gen. Appearance is normal thin  Ectomorphic no deformities  oriented 3  Pleasant mood  Normal ambulatory status with no assistive devices and no limp   Left knee medial joint line tenderness small effusion normal alignment  Full range of motion  Anterior and posterior drawer test normal collateral ligament stable  Quadriceps muscle tone normal no atrophy   McMurray sign positive for medial meniscal tear   skin normal   Sensation normal   Vascular status of the limb normal 2+ pulses normal color and temperature no peripheral edema    Her MRI showed a torn medial meniscus back in 2012 or plain films  show very minimal degenerative changes   Diagnosis torn medial meniscus left knee    Plan arthroscopy left knee partial medial meniscectomy    You have decided to proceed with operative arthroscopy of the knee. You have decided not to continue with nonoperative measures such as but not limited to oral medication, weight loss, activity modification, physical therapy, bracing, or injection.  We will perform operative arthroscopy of the knee. Some of the risks associated with arthroscopic surgery of the knee include but are not limited to Bleeding Infection Swelling Stiffness Blood clot Pain  If you're not comfortable with these  risks and would like to continue with nonoperative treatment please let Dr. Romeo Apple know prior to your surgery.

## 2015-09-17 NOTE — Telephone Encounter (Signed)
Regarding out-patient surgery scheduled at [facility] 09/21/15, CPT 29880, 29881, contacted insurer [employer] [place], ph# [phone]; per automated voice response system, "most out-patient services do not require pre-authorization, per Call Reference [phone].  Requested to speak with representative; per Shirlean SchleinBriella B, no pre-authorization required for these procedure codes - Call Reference [phone], 09/17/15, 3:49p.m.

## 2015-09-17 NOTE — Patient Instructions (Signed)
SURGERY 09/21/15 LEFT KNEE ARTHROSCOPY WITH MEDIAL MENISECTOMY

## 2015-09-18 NOTE — Patient Instructions (Signed)
Maurice SmallJulia C Zulauf  09/18/2015     @PREFPERIOPPHARMACY @   Your procedure is scheduled on 09/21/2015  [facility]45 [facility].  Call this number if you have problems the morning of surgery:  [phone]   Remember:  Do not eat food or drink liquids after midnight.  Take these medicines the morning of surgery with A SIP OF WATER NA   Do not wear jewelry, make-up or nail polish.  Do not wear lotions, powders, or perfumes.  You may wear deodorant.  Do not shave 48 hours prior to surgery.  Men may shave face and neck.  Do not bring valuables to the hospital.  [facility] not responsible for any belongings or valuables.  Contacts, dentures or bridgework may not be worn into surgery.  Leave your suitcase in the car.  After surgery it may be brought to your room.  For patients admitted to the hospital, discharge time will be determined by your treatment team.  Patients discharged the day of surgery will not be allowed to drive home.    Please read over the following fact sheets that you were given. Surgical Site Infection Prevention and Anesthesia Post-op Instructions     PATIENT INSTRUCTIONS POST-ANESTHESIA  IMMEDIATELY FOLLOWING SURGERY:  Do not drive or operate machinery for the first twenty four hours after surgery.  Do not make any important decisions for twenty four hours after surgery or while taking narcotic pain medications or sedatives.  If you develop intractable nausea and vomiting or a severe headache please notify your doctor immediately.  FOLLOW-UP:  Please make an appointment with your surgeon as instructed. You do not need to follow up with anesthesia unless specifically instructed to do so.  WOUND CARE INSTRUCTIONS (if applicable):  Keep a dry clean dressing on the anesthesia/puncture wound site if there is drainage.  Once the wound has quit draining you may leave it open to air.  Generally you should leave the bandage intact for twenty four hours unless  there is drainage.  If the epidural site drains for more than 36-48 hours please call the anesthesia department.  QUESTIONS?:  Please feel free to call your physician or the hospital operator if you have any questions, and they will be happy to assist you.      Knee Arthroscopy Knee arthroscopy is a surgical procedure that is used to examine the inside of your knee joint and repair any damage. The surgeon puts a small, lighted instrument with a camera on the tip (arthroscope) through a small incision in your knee. The camera sends pictures to a monitor in the operating room. Your surgeon uses those pictures to guide the surgical instruments through other incisions to the area of damage. Knee arthroscopy can be used to treat many types of knee problems. It may be used:  To repair a torn ligament.  To repair or remove damaged tissue.  To remove a fluid-filled sac (cyst) from your knee. LET [facility] KNOW ABOUT:  Any allergies you have.  All medicines you are taking, including vitamins, herbs, eye drops, creams, and over-the-counter medicines.  Previous problems you or members of your family have had with the use of anesthetics.  Any blood disorders you have.  Previous surgeries you have had.  Any medical conditions you may have. RISKS AND COMPLICATIONS Generally, this is a safe procedure. However, problems may occur, including:  Infection.  Bleeding.  Damage to blood vessels, nerves, or structures of your  knee.  A blood clot that forms in your leg and travels to your lung.  Failure to relieve symptoms. BEFORE THE PROCEDURE  Ask your health care provider about:  Changing or stopping your regular medicines. This is especially important if you are taking diabetes medicines or blood thinners.  Taking medicines such as aspirin and ibuprofen. These medicines can thin your blood. Do not take these medicines before your procedure if your health care provider  instructs you not to.  Follow your health care provider's instructions about eating or drinking restrictions.  Plan to have someone take you home after the procedure.  If you go home right after the procedure, plan to have someone with you for 24 hours.  Do not drink alcohol unless your health care provider says that you can.  Do not use any tobacco products, including cigarettes, chewing tobacco, or electronic cigarettes unless your health care provider says that you can. If you need help quitting, ask your health care provider.  You may have a physical exam. PROCEDURE  An IV tube will be inserted into one of your veins.  You will be given one or more of the following:  A medicine that helps you relax (sedative).  A medicine that numbs the area (local anesthetic).  A medicine that makes you fall asleep (general anesthetic).  A medicine that is injected into your spine that numbs the area below and slightly above the injection site (spinal anesthetic).  A medicine that is injected into an area of your body that numbs everything below the injection site (regional anesthetic).  A cuff may be placed around your upper leg to slow bleeding during the procedure.  The surgeon will make a small number of incisions around your knee.  Your knee joint will be flushed and filled with a germ-free (sterile) solution.  The arthroscope will be passed through an incision into your knee joint.  More instruments will be passed through other incisions to repair your knee as needed.  The fluid will be removed from your knee.  The incisions will be closed with adhesive strips or stitches (sutures).  A bandage (dressing) will be placed over your knee. The procedure may vary among health care providers and hospitals. AFTER THE PROCEDURE  Your blood pressure, heart rate, breathing rate and blood oxygen level will be monitored often until the medicines you were given have worn off.  You may be  given medicine for pain.  You may get crutches to help you walk without using your knee to support your body weight.  You may have to wear compression stockings. These stocking help to prevent blood clots and reduce swelling in your legs.   This information is not intended to replace advice given to you by your health care provider. Make sure you discuss any questions you have with your health care provider.   Document Released: 10/24/2000 Document Revised: 03/13/2015 Document Reviewed: 10/23/2014 Elsevier Interactive Patient Education [employer].

## 2015-09-19 ENCOUNTER — Encounter (HOSPITAL_COMMUNITY)
Admission: RE | Admit: 2015-09-19 | Discharge: 2015-09-19 | Disposition: A | Discharge status: Home / Self Care | Payer: BLUE CROSS/BLUE SHIELD | Source: Ambulatory Visit | Attending: Orthopedic Surgery | Admitting: Orthopedic Surgery

## 2015-09-19 ENCOUNTER — Encounter (HOSPITAL_COMMUNITY): Payer: Self-pay

## 2015-09-19 DIAGNOSIS — S83242A Other tear of medial meniscus, current injury, left knee, initial encounter: Secondary | ICD-10-CM | POA: Diagnosis present

## 2015-09-19 DIAGNOSIS — W010XXA Fall on same level from slipping, tripping and stumbling without subsequent striking against object, initial encounter: Secondary | ICD-10-CM | POA: Diagnosis not present

## 2015-09-19 DIAGNOSIS — Z01812 Encounter for preprocedural laboratory examination: Secondary | ICD-10-CM | POA: Insufficient documentation

## 2015-09-19 LAB — BASIC METABOLIC PANEL
Anion gap: 9 (ref 5–15)
BUN: 22 mg/dL — AB (ref 6–20)
CALCIUM: 10.1 mg/dL (ref 8.9–10.3)
CO2: 29 mmol/L (ref 22–32)
CREATININE: 0.99 mg/dL (ref 0.44–1.00)
Chloride: 100 mmol/L — ABNORMAL LOW (ref 101–111)
GFR calc non Af Amer: >60 mL/min (ref >60)
GLUCOSE: 181 mg/dL — AB (ref 65–99)
Potassium: 4.4 mmol/L (ref 3.5–5.1)
Sodium: 138 mmol/L (ref 135–145)

## 2015-09-19 LAB — CBC
HEMATOCRIT: 40.4 % (ref 36.0–46.0)
Hemoglobin: 13.7 g/dL (ref 12.0–15.0)
MCH: 32.8 pg (ref 26.0–34.0)
MCHC: 33.9 g/dL (ref 30.0–36.0)
MCV: 96.7 fL (ref 78.0–100.0)
Platelets: 245 10*3/uL (ref 150–400)
RBC: 4.18 MIL/uL (ref 3.87–5.11)
RDW: 12.3 % (ref 11.5–15.5)
WBC: 6.5 10*3/uL (ref 4.0–10.5)

## 2015-09-19 NOTE — Pre-Procedure Instructions (Signed)
Patient given information to sign up for my chart at home. 

## 2015-09-21 ENCOUNTER — Ambulatory Visit (HOSPITAL_COMMUNITY)
Admission: RE | Admit: 2015-09-21 | Discharge: 2015-09-21 | Disposition: A | Discharge status: Home / Self Care | Payer: BLUE CROSS/BLUE SHIELD | Source: Ambulatory Visit | Attending: Orthopedic Surgery | Admitting: Orthopedic Surgery

## 2015-09-21 ENCOUNTER — Encounter (HOSPITAL_COMMUNITY)
Admission: RE | Disposition: A | Discharge status: Home / Self Care | Payer: Self-pay | Source: Ambulatory Visit | Attending: Orthopedic Surgery

## 2015-09-21 ENCOUNTER — Encounter (HOSPITAL_COMMUNITY): Payer: Self-pay | Admitting: *Deleted

## 2015-09-21 ENCOUNTER — Ambulatory Visit (HOSPITAL_COMMUNITY): Payer: BLUE CROSS/BLUE SHIELD | Admitting: Anesthesiology

## 2015-09-21 DIAGNOSIS — S83242A Other tear of medial meniscus, current injury, left knee, initial encounter: Secondary | ICD-10-CM | POA: Diagnosis not present

## 2015-09-21 DIAGNOSIS — M23329 Other meniscus derangements, posterior horn of medial meniscus, unspecified knee: Secondary | ICD-10-CM | POA: Insufficient documentation

## 2015-09-21 DIAGNOSIS — M23322 Other meniscus derangements, posterior horn of medial meniscus, left knee: Secondary | ICD-10-CM

## 2015-09-21 DIAGNOSIS — M25562 Pain in left knee: Secondary | ICD-10-CM

## 2015-09-21 HISTORY — PX: KNEE ARTHROSCOPY WITH MEDIAL MENISECTOMY: SHX5651

## 2015-09-21 SURGERY — ARTHROSCOPY, KNEE, WITH MEDIAL MENISCECTOMY
Anesthesia: General | Site: Knee | Laterality: Left

## 2015-09-21 MED ORDER — PROPOFOL 10 MG/ML IV BOLUS
INTRAVENOUS | Status: DC | PRN
  Administered 2015-09-21: 140 mg via INTRAVENOUS

## 2015-09-21 MED ORDER — BUPIVACAINE-EPINEPHRINE (PF) 0.5% -1:200000 IJ SOLN
INTRAMUSCULAR | Status: DC | PRN
  Administered 2015-09-21: 60 mL via PERINEURAL

## 2015-09-21 MED ORDER — CEFAZOLIN SODIUM-DEXTROSE 2-3 GM-% IV SOLR
2.0000 g | INTRAVENOUS | Status: AC
  Administered 2015-09-21: 2 g via INTRAVENOUS

## 2015-09-21 MED ORDER — 0.9 % SODIUM CHLORIDE (POUR BTL) OPTIME
TOPICAL | Status: DC | PRN
  Administered 2015-09-21: 1000 mL

## 2015-09-21 MED ORDER — FENTANYL CITRATE (PF) 100 MCG/2ML IJ SOLN
25.0000 ug | INTRAMUSCULAR | Status: AC
  Administered 2015-09-21: 10:00:00 via INTRAVENOUS
  Administered 2015-09-21: 25 ug via INTRAVENOUS

## 2015-09-21 MED ORDER — PROPOFOL 10 MG/ML IV BOLUS
INTRAVENOUS | Status: AC
  Filled 2015-09-21: qty 20

## 2015-09-21 MED ORDER — ONDANSETRON HCL 4 MG/2ML IJ SOLN
4.0000 mg | Freq: Once | INTRAMUSCULAR | Status: AC
  Administered 2015-09-21: 4 mg via INTRAVENOUS
  Filled 2015-09-21: qty 2

## 2015-09-21 MED ORDER — LIDOCAINE HCL 1 % IJ SOLN
INTRAMUSCULAR | Status: DC | PRN
  Administered 2015-09-21: 25 mg via INTRADERMAL

## 2015-09-21 MED ORDER — SODIUM CHLORIDE 0.9 % IR SOLN
Status: DC | PRN
  Administered 2015-09-21 (×3): 3000 mL

## 2015-09-21 MED ORDER — ONDANSETRON HCL 4 MG/2ML IJ SOLN
INTRAMUSCULAR | Status: AC
  Filled 2015-09-21: qty 2

## 2015-09-21 MED ORDER — ONDANSETRON HCL 4 MG/2ML IJ SOLN: 4.0000 mg | Freq: Once | INTRAMUSCULAR | Status: DC | PRN

## 2015-09-21 MED ORDER — MIDAZOLAM HCL 2 MG/2ML IJ SOLN
1.0000 mg | INTRAMUSCULAR | Status: DC | PRN
  Administered 2015-09-21: 2 mg via INTRAVENOUS

## 2015-09-21 MED ORDER — LIDOCAINE HCL (PF) 1 % IJ SOLN
INTRAMUSCULAR | Status: AC
  Filled 2015-09-21: qty 5

## 2015-09-21 MED ORDER — EPINEPHRINE HCL 1 MG/ML IJ SOLN
INTRAMUSCULAR | Status: AC
Start: 2015-09-21 — End: 2015-09-21
  Filled 2015-09-21: qty 2

## 2015-09-21 MED ORDER — MIDAZOLAM HCL 2 MG/2ML IJ SOLN
INTRAMUSCULAR | Status: AC
  Filled 2015-09-21: qty 2

## 2015-09-21 MED ORDER — LACTATED RINGERS IV SOLN
INTRAVENOUS | Status: DC
  Administered 2015-09-21: 10:00:00 via INTRAVENOUS

## 2015-09-21 MED ORDER — FENTANYL CITRATE (PF) 100 MCG/2ML IJ SOLN
25.0000 ug | INTRAMUSCULAR | Status: DC | PRN
  Administered 2015-09-21 (×2): 25 ug via INTRAVENOUS
  Filled 2015-09-21: qty 2

## 2015-09-21 MED ORDER — CEFAZOLIN SODIUM 1-5 GM-% IV SOLN
INTRAVENOUS | Status: AC
  Filled 2015-09-21: qty 100

## 2015-09-21 MED ORDER — PROMETHAZINE HCL 12.5 MG PO TABS: 12.5000 mg | ORAL_TABLET | Freq: Four times a day (QID) | ORAL | Status: DC | PRN

## 2015-09-21 MED ORDER — LIDOCAINE HCL (PF) 1 % IJ SOLN
INTRAMUSCULAR | Status: AC
  Filled 2015-09-21: qty 10

## 2015-09-21 MED ORDER — MIDAZOLAM HCL 2 MG/2ML IJ SOLN
INTRAMUSCULAR | Status: AC
  Filled 2015-09-21: qty 4

## 2015-09-21 MED ORDER — HYDROCODONE-ACETAMINOPHEN 7.5-325 MG PO TABS: 1.0000 | ORAL_TABLET | Freq: Four times a day (QID) | ORAL | Status: DC | PRN

## 2015-09-21 MED ORDER — FENTANYL CITRATE (PF) 100 MCG/2ML IJ SOLN
INTRAMUSCULAR | Status: DC | PRN
  Administered 2015-09-21 (×2): 25 ug via INTRAVENOUS
  Administered 2015-09-21: 50 ug via INTRAVENOUS

## 2015-09-21 MED ORDER — MIDAZOLAM HCL 5 MG/5ML IJ SOLN
INTRAMUSCULAR | Status: DC | PRN
  Administered 2015-09-21: 2 mg via INTRAVENOUS

## 2015-09-21 MED ORDER — KETOROLAC TROMETHAMINE 30 MG/ML IJ SOLN
30.0000 mg | Freq: Once | INTRAMUSCULAR | Status: AC
  Administered 2015-09-21: 30 mg via INTRAVENOUS
  Filled 2015-09-21: qty 1

## 2015-09-21 MED ORDER — CHLORHEXIDINE GLUCONATE 4 % EX LIQD: 60.0000 mL | Freq: Once | CUTANEOUS | Status: DC

## 2015-09-21 MED ORDER — CEFAZOLIN SODIUM-DEXTROSE 2-3 GM-% IV SOLR
INTRAVENOUS | Status: AC
  Filled 2015-09-21: qty 50

## 2015-09-21 MED ORDER — HYDROCODONE-ACETAMINOPHEN 5-325 MG PO TABS
1.0000 | ORAL_TABLET | Freq: Once | ORAL | Status: AC
  Administered 2015-09-21: 1 via ORAL
  Filled 2015-09-21: qty 1

## 2015-09-21 MED ORDER — ONDANSETRON HCL 4 MG/2ML IJ SOLN
4.0000 mg | Freq: Once | INTRAMUSCULAR | Status: AC
  Administered 2015-09-21: 4 mg via INTRAVENOUS

## 2015-09-21 MED ORDER — FENTANYL CITRATE (PF) 100 MCG/2ML IJ SOLN
INTRAMUSCULAR | Status: AC
  Filled 2015-09-21: qty 2

## 2015-09-21 MED ORDER — FENTANYL CITRATE (PF) 100 MCG/2ML IJ SOLN
INTRAMUSCULAR | Status: AC
  Filled 2015-09-21: qty 4

## 2015-09-21 MED ORDER — BUPIVACAINE-EPINEPHRINE (PF) 0.5% -1:200000 IJ SOLN
INTRAMUSCULAR | Status: AC
  Filled 2015-09-21: qty 60

## 2015-09-21 MED ORDER — EPINEPHRINE HCL 1 MG/ML IJ SOLN
INTRAMUSCULAR | Status: AC
  Filled 2015-09-21: qty 2

## 2015-09-21 SURGICAL SUPPLY — 54 items
ARTHROWAND PARAGON T2 (SURGICAL WAND)
BAG HAMPER (MISCELLANEOUS) ×3 IMPLANT
BANDAGE ELASTIC 6 VELCRO NS (GAUZE/BANDAGES/DRESSINGS) ×3 IMPLANT
BLADE AGGRESSIVE PLUS 4.0 (BLADE) ×3 IMPLANT
CHLORAPREP W/TINT 26ML (MISCELLANEOUS) ×4 IMPLANT
CLOTH BEACON ORANGE TIMEOUT ST (SAFETY) ×3 IMPLANT
COOLER CRYO IC GRAV AND TUBE (ORTHOPEDIC SUPPLIES) ×3 IMPLANT
CUFF CRYO KNEE LG 20X31 COOLER (ORTHOPEDIC SUPPLIES) IMPLANT
CUFF CRYO KNEE18X23 MED (MISCELLANEOUS) ×2 IMPLANT
CUFF TOURNIQUET SINGLE 34IN LL (TOURNIQUET CUFF) ×2 IMPLANT
CUFF TOURNIQUET SINGLE 44IN (TOURNIQUET CUFF) IMPLANT
CUTTER ANGLED DBL BITE 4.5 (BURR) IMPLANT
DECANTER SPIKE VIAL GLASS SM (MISCELLANEOUS) ×6 IMPLANT
GAUZE SPONGE 4X4 12PLY STRL (GAUZE/BANDAGES/DRESSINGS) ×3 IMPLANT
GAUZE SPONGE 4X4 16PLY XRAY LF (GAUZE/BANDAGES/DRESSINGS) ×3 IMPLANT
GAUZE XEROFORM 5X9 LF (GAUZE/BANDAGES/DRESSINGS) ×3 IMPLANT
GLOVE BIOGEL PI IND STRL 7.0 (GLOVE) IMPLANT
GLOVE BIOGEL PI INDICATOR 7.0 (GLOVE) ×4
GLOVE ECLIPSE 6.5 STRL STRAW (GLOVE) ×2 IMPLANT
GLOVE SKINSENSE NS SZ8.0 LF (GLOVE) ×2
GLOVE SKINSENSE STRL SZ8.0 LF (GLOVE) ×1 IMPLANT
GLOVE SS N UNI LF 8.5 STRL (GLOVE) ×3 IMPLANT
GOWN STRL REUS W/ TWL LRG LVL3 (GOWN DISPOSABLE) ×1 IMPLANT
GOWN STRL REUS W/TWL LRG LVL3 (GOWN DISPOSABLE) ×3
GOWN STRL REUS W/TWL XL LVL3 (GOWN DISPOSABLE) ×3 IMPLANT
HLDR LEG FOAM (MISCELLANEOUS) ×1 IMPLANT
IV NS IRRIG 3000ML ARTHROMATIC (IV SOLUTION) ×6 IMPLANT
KIT BLADEGUARD II DBL (SET/KITS/TRAYS/PACK) ×3 IMPLANT
KIT ROOM TURNOVER AP CYSTO (KITS) ×3 IMPLANT
LEG HOLDER FOAM (MISCELLANEOUS) ×2
MANIFOLD NEPTUNE II (INSTRUMENTS) ×3 IMPLANT
MARKER SKIN DUAL TIP RULER LAB (MISCELLANEOUS) ×3 IMPLANT
NDL HYPO 18GX1.5 BLUNT FILL (NEEDLE) ×1 IMPLANT
NDL HYPO 21X1.5 SAFETY (NEEDLE) ×1 IMPLANT
NDL SPNL 18GX3.5 QUINCKE PK (NEEDLE) ×1 IMPLANT
NEEDLE HYPO 18GX1.5 BLUNT FILL (NEEDLE) ×3 IMPLANT
NEEDLE HYPO 21X1.5 SAFETY (NEEDLE) ×3 IMPLANT
NEEDLE SPNL 18GX3.5 QUINCKE PK (NEEDLE) ×3 IMPLANT
NS IRRIG 1000ML POUR BTL (IV SOLUTION) ×3 IMPLANT
PACK ARTHRO LIMB DRAPE STRL (MISCELLANEOUS) ×3 IMPLANT
PAD ABD 5X9 TENDERSORB (GAUZE/BANDAGES/DRESSINGS) ×3 IMPLANT
PAD ARMBOARD 7.5X6 YLW CONV (MISCELLANEOUS) ×3 IMPLANT
PADDING CAST COTTON 6X4 STRL (CAST SUPPLIES) ×3 IMPLANT
PADDING WEBRIL 6 STERILE (GAUZE/BANDAGES/DRESSINGS) ×2 IMPLANT
SET ARTHROSCOPY INST (INSTRUMENTS) ×3 IMPLANT
SET ARTHROSCOPY PUMP TUBE (IRRIGATION / IRRIGATOR) ×3 IMPLANT
SET BASIN LINEN APH (SET/KITS/TRAYS/PACK) ×3 IMPLANT
SUT ETHILON 3 0 FSL (SUTURE) ×2 IMPLANT
SYR 30ML LL (SYRINGE) ×3 IMPLANT
SYRINGE 10CC LL (SYRINGE) ×3 IMPLANT
WAND 50 DEG COVAC W/CORD (SURGICAL WAND) ×2 IMPLANT
WAND 90 DEG TURBOVAC W/CORD (SURGICAL WAND) IMPLANT
WAND ARTHRO PARAGON T2 (SURGICAL WAND) IMPLANT
YANKAUER SUCT BULB TIP 10FT TU (MISCELLANEOUS) ×9 IMPLANT

## 2015-09-21 NOTE — Brief Op Note (Signed)
09/21/2015  11:45 AM  PATIENT:  Gabrielle Rodriguez  54 y.o. female  PRE-OPERATIVE DIAGNOSIS:  left medial meniscus tear  POST-OPERATIVE DIAGNOSIS:  left medial meniscus tear,osteoarthritis left knee  PROCEDURE:  Procedure(s): KNEE ARTHROSCOPY WITH PARTIAL MEDIAL MENISECTOMY (Left)   Chondral changes throughout the knee primarily on the medial femoral condyle with grade 2 lesion. There was a posterior horn root tear of the medial meniscus. Mild synovitis was also noted in the knee. Anterior cruciate ligament and PCL were intact and lateral compartment was normal  Surgery was done as follows. In the preop area the site was confirmed and the chart review was completed. Patient was taken the operating room for general anesthesia. Ancef was given IV per weight.  The patient was supine. Left leg arthroscopic leg holder right leg padded  Timeout completed  Lateral portal established scope placed in the joint diagnostic arthroscopy completed in circumferential manner and then reversed.  Medial portal established and diagnostic arthroscopy repeated for the third time  Findings are noted above primarily we had degenerative arthritis in the medial compartment with a degenerative posterior horn root meniscal tear.  This was handled by upbiter to resect the torn meniscus and then this was balanced with an arthroscopic shaver to remove meniscal fragments balance the knee supplemented with a 50 ArthroCare wand. Repeat probing to confirm stable rim was performed  We went through the knee one more time just to make sure there was no other pathology and then irrigated injected and closed with 3-0 nylon sutures    SURGEON:  Surgeon(s) and Role:    * Vickki HearingStanley E Gianfranco Araki, MD - Primary  PHYSICIAN ASSISTANT:   ASSISTANTS: none   ANESTHESIA:   general  EBL:  Total I/O In: 800 [I.V.:800] Out: -   BLOOD ADMINISTERED:none  DRAINS: none   LOCAL MEDICATIONS USED:  MARCAINE   , Amount: 60 ml and  OTHER epi  SPECIMEN:  No Specimen  DISPOSITION OF SPECIMEN:  N/A  COUNTS:  YES  TOURNIQUET:    DICTATION: .Dragon Dictation  PLAN OF CARE: Discharge to home after PACU  PATIENT DISPOSITION:  PACU - hemodynamically stable.   Delay start of Pharmacological VTE agent (>24hrs) due to surgical blood loss or risk of bleeding: not applicable

## 2015-09-21 NOTE — Op Note (Signed)
09/21/2015  11:45 AM  PATIENT:  Gabrielle Rodriguez  54 y.o. female  PRE-OPERATIVE DIAGNOSIS:  left medial meniscus tear  POST-OPERATIVE DIAGNOSIS:  left medial meniscus tear,osteoarthritis left knee  PROCEDURE:  Procedure(s): KNEE ARTHROSCOPY WITH PARTIAL MEDIAL MENISECTOMY (Left)   Chondral changes throughout the knee primarily on the medial femoral condyle with grade 2 lesion. There was a posterior horn root tear of the medial meniscus. Mild synovitis was also noted in the knee. Anterior cruciate ligament and PCL were intact and lateral compartment was normal  Surgery was done as follows. In the preop area the site was confirmed and the chart review was completed. Patient was taken the operating room for general anesthesia. Ancef was given IV per weight.  The patient was supine. Left leg arthroscopic leg holder right leg padded  Timeout completed  Lateral portal established scope placed in the joint diagnostic arthroscopy completed in circumferential manner and then reversed.  Medial portal established and diagnostic arthroscopy repeated for the third time  Findings are noted above primarily we had degenerative arthritis in the medial compartment with a degenerative posterior horn root meniscal tear.  This was handled by upbiter to resect the torn meniscus and then this was balanced with an arthroscopic shaver to remove meniscal fragments balance the knee supplemented with a 50 ArthroCare wand. Repeat probing to confirm stable rim was performed  We went through the knee one more time just to make sure there was no other pathology and then irrigated injected and closed with 3-0 nylon sutures    SURGEON:  Surgeon(s) and Role:    * Akio Hudnall E Elainna Eshleman, MD - Primary  PHYSICIAN ASSISTANT:   ASSISTANTS: none   ANESTHESIA:   general  EBL:  Total I/O In: 800 [I.V.:800] Out: -   BLOOD ADMINISTERED:none  DRAINS: none   LOCAL MEDICATIONS USED:  MARCAINE   , Amount: 60 ml and  OTHER epi  SPECIMEN:  No Specimen  DISPOSITION OF SPECIMEN:  N/A  COUNTS:  YES  TOURNIQUET:    DICTATION: .Dragon Dictation  PLAN OF CARE: Discharge to home after PACU  PATIENT DISPOSITION:  PACU - hemodynamically stable.   Delay start of Pharmacological VTE agent (>24hrs) due to surgical blood loss or risk of bleeding: not applicable  

## 2015-09-21 NOTE — Anesthesia Postprocedure Evaluation (Signed)
  Anesthesia Post-op Note  Patient: Gabrielle Rodriguez  Procedure(s) Performed: Procedure(s): KNEE ARTHROSCOPY WITH PARTIAL MEDIAL MENISECTOMY (Left)  Patient Location: PACU  Anesthesia Type:General  Level of Consciousness: awake, alert , oriented and patient cooperative  Airway and Oxygen Therapy: Patient Spontanous Breathing  Post-op Pain: 2 /10, mild  Post-op Assessment: Post-op Vital signs reviewed, Patient's Cardiovascular Status Stable, Respiratory Function Stable, Patent Airway, No signs of Nausea or vomiting and Pain level controlled              Post-op Vital Signs: Reviewed and stable  Last Vitals:  Filed Vitals:   09/21/15 1025  BP: 101/66  Pulse:   Temp:   Resp: 16    Complications: No apparent anesthesia complications

## 2015-09-21 NOTE — Anesthesia Preprocedure Evaluation (Signed)
Anesthesia Evaluation  Patient identified by MRN, date of birth, ID band Patient awake    Reviewed: Allergy & Precautions, H&P , NPO status , Patient's Chart, lab work & pertinent test results  Airway Mallampati: I  TM Distance: >3 FB Neck ROM: Full    Dental no notable dental hx. (+) Teeth Intact   Pulmonary neg pulmonary ROS,    Pulmonary exam normal breath sounds clear to auscultation       Cardiovascular negative cardio ROS Normal cardiovascular exam Rhythm:Regular Rate:Normal     Neuro/Psych negative neurological ROS  negative psych ROS   GI/Hepatic negative GI ROS, Neg liver ROS,   Endo/Other  negative endocrine ROS  Renal/GU negative Renal ROS  negative genitourinary   Musculoskeletal negative musculoskeletal ROS (+)   Abdominal   Peds  Hematology negative hematology ROS (+)   Anesthesia Other Findings   Reproductive/Obstetrics Cystocele Uterine Prolapse                             Anesthesia Physical Anesthesia Plan  ASA: I  Anesthesia Plan: General   Post-op Pain Management:    Induction: Intravenous  Airway Management Planned: LMA  Additional Equipment:   Intra-op Plan:   Post-operative Plan: Extubation in OR  Informed Consent: I have reviewed the patients History and Physical, chart, labs and discussed the procedure including the risks, benefits and alternatives for the proposed anesthesia with the patient or authorized representative who has indicated his/her understanding and acceptance.     Plan Discussed with:   Anesthesia Plan Comments:         Anesthesia Quick Evaluation

## 2015-09-21 NOTE — Anesthesia Procedure Notes (Signed)
Procedure Name: LMA Insertion Date/Time: 09/21/2015 10:42 AM Performed by: Despina HiddenIDACAVAGE, Kniyah Khun J Pre-anesthesia Checklist: Patient identified, Emergency Drugs available, Suction available and Patient being monitored Patient Re-evaluated:Patient Re-evaluated prior to inductionOxygen Delivery Method: Circle system utilized Preoxygenation: Pre-oxygenation with 100% oxygen Intubation Type: IV induction Ventilation: Mask ventilation without difficulty LMA: LMA inserted LMA Size: 3.0 Grade View: Grade I Number of attempts: 1 Placement Confirmation: positive ETCO2 and breath sounds checked- equal and bilateral Tube secured with: Tape Dental Injury: Teeth and Oropharynx as per pre-operative assessment

## 2015-09-21 NOTE — Interval H&P Note (Signed)
History and Physical Interval Note:  09/21/2015 10:12 AM  Gabrielle Rodriguez  has presented today for surgery, with the diagnosis of left medial meniscus tear  The various methods of treatment have been discussed with the patient and family. After consideration of risks, benefits and other options for treatment, the patient has consented to  Procedure(s): KNEE ARTHROSCOPY WITH MEDIAL MENISECTOMY (Left) as a surgical intervention .  The patient's history has been reviewed, patient examined, no change in status, stable for surgery.  I have reviewed the patient's chart and labs.  Questions were answered to the patient's satisfaction.     Fuller CanadaStanley Harrison

## 2015-09-21 NOTE — Transfer of Care (Signed)
Immediate Anesthesia Transfer of Care Note  Patient: Gabrielle Rodriguez  Procedure(s) Performed: Procedure(s): KNEE ARTHROSCOPY WITH PARTIAL MEDIAL MENISECTOMY (Left)  Patient Location: PACU  Anesthesia Type:General  Level of Consciousness: awake, alert  and patient cooperative  Airway & Oxygen Therapy: Patient Spontanous Breathing and Patient connected to face mask oxygen  Post-op Assessment: Report given to RN, Post -op Vital signs reviewed and stable and Patient moving all extremities  Post vital signs: Reviewed and stable  Last Vitals:  Filed Vitals:   09/21/15 1025  BP: 101/66  Pulse:   Temp:   Resp: 16    Complications: No apparent anesthesia complications

## 2015-09-24 ENCOUNTER — Encounter (HOSPITAL_COMMUNITY): Payer: Self-pay | Admitting: Orthopedic Surgery

## 2015-09-24 ENCOUNTER — Encounter: Payer: Self-pay | Admitting: Orthopedic Surgery

## 2015-09-24 ENCOUNTER — Ambulatory Visit (INDEPENDENT_AMBULATORY_CARE_PROVIDER_SITE_OTHER): Payer: BLUE CROSS/BLUE SHIELD | Admitting: Orthopedic Surgery

## 2015-09-24 VITALS — BP 141/93 | Ht 66.0 in | Wt 150.0 lb

## 2015-09-24 DIAGNOSIS — Z9889 Other specified postprocedural states: Secondary | ICD-10-CM

## 2015-09-24 NOTE — Progress Notes (Signed)
Patient ID: Gabrielle Rodriguez, female   DOB: 10-Dec-1960, 54 y.o.   MRN: [id number]   POST OP VISIT  S/P KNEE ARTHROSCOPY  Chief Complaint  Patient presents with  . Follow-up    post op 1, Left SALK, DOS 09/21/15     BP 141/93 mmHg  Ht 5\' 6"  (1.676 m)  Wt 150 lb (68.04 kg)  BMI 24.22 kg/m2  LMP 07/03/2011  General posterior horn root tear we did a partial meniscectomy she had diffuse arthritis more than I thought she would have but she is doing well she is walking with crutches but now she's not straightening her knee out so I gave her some exercises to do for that we remove the sutures and she can start physical therapy    ASSESSMENT AND PLAN   She will follow-up with me on the 29th she scheduled to go back to work on the 28th limited duty.

## 2015-09-24 NOTE — Telephone Encounter (Signed)
Done

## 2015-09-25 ENCOUNTER — Ambulatory Visit (HOSPITAL_COMMUNITY): Payer: BLUE CROSS/BLUE SHIELD | Attending: Orthopedic Surgery | Admitting: Physical Therapy

## 2015-09-25 DIAGNOSIS — Z9889 Other specified postprocedural states: Secondary | ICD-10-CM | POA: Insufficient documentation

## 2015-09-25 DIAGNOSIS — M25462 Effusion, left knee: Secondary | ICD-10-CM | POA: Insufficient documentation

## 2015-09-25 DIAGNOSIS — R2681 Unsteadiness on feet: Secondary | ICD-10-CM | POA: Insufficient documentation

## 2015-09-25 DIAGNOSIS — R29898 Other symptoms and signs involving the musculoskeletal system: Secondary | ICD-10-CM | POA: Insufficient documentation

## 2015-09-25 DIAGNOSIS — M25662 Stiffness of left knee, not elsewhere classified: Secondary | ICD-10-CM | POA: Insufficient documentation

## 2015-09-27 ENCOUNTER — Ambulatory Visit (HOSPITAL_COMMUNITY): Payer: BLUE CROSS/BLUE SHIELD

## 2015-09-27 DIAGNOSIS — M25462 Effusion, left knee: Secondary | ICD-10-CM | POA: Diagnosis present

## 2015-09-27 DIAGNOSIS — Z9889 Other specified postprocedural states: Secondary | ICD-10-CM | POA: Diagnosis present

## 2015-09-27 DIAGNOSIS — M25662 Stiffness of left knee, not elsewhere classified: Secondary | ICD-10-CM | POA: Diagnosis present

## 2015-09-27 DIAGNOSIS — R2681 Unsteadiness on feet: Secondary | ICD-10-CM | POA: Diagnosis present

## 2015-09-27 DIAGNOSIS — R29898 Other symptoms and signs involving the musculoskeletal system: Secondary | ICD-10-CM

## 2015-09-27 NOTE — Patient Instructions (Signed)
  Slide heel toward butt, use strap to increase stretch into bending. Hold for 5 seconds, alternate quad sets.Perform 15x 3 times each day.     Lying flat, maintain a straight knee and raise foot 8 inches from ground. Hold 1 seconds return to ground. If knee begins to bend, use strap to maintain straight.  Perform 15x 3 times each day.   Bridging: maintain knees and feet about 4-6 inches apart. Lift pelvis and hold 1 sec. Perform 15x 3 times each day.

## 2015-09-28 ENCOUNTER — Ambulatory Visit (HOSPITAL_COMMUNITY): Payer: BLUE CROSS/BLUE SHIELD

## 2015-09-28 DIAGNOSIS — Z9889 Other specified postprocedural states: Secondary | ICD-10-CM | POA: Diagnosis not present

## 2015-09-28 DIAGNOSIS — R29898 Other symptoms and signs involving the musculoskeletal system: Secondary | ICD-10-CM

## 2015-09-28 DIAGNOSIS — M25462 Effusion, left knee: Secondary | ICD-10-CM

## 2015-09-28 DIAGNOSIS — M25662 Stiffness of left knee, not elsewhere classified: Secondary | ICD-10-CM

## 2015-09-28 DIAGNOSIS — R2681 Unsteadiness on feet: Secondary | ICD-10-CM

## 2015-09-28 NOTE — Patient Instructions (Signed)
Quad Sets    Slowly tighten thigh muscles of straight, left leg while counting out loud to 5 seconds. Relax. Repeat 10-20 times. Do 1-2 sessions per day.  http://gt2.exer.us/293   HIP / KNEE: Flexion, Heel Slides - Supine    Slide heel up toward buttocks, keeping leg in straight line. 10-20 reps per set, 2 sets per day Use towel or pillowcase under heel as needed.  HIP: Flexion / KNEE: Extension, Straight Leg Raise    Raise leg, keeping knee straight. Perform slowly. 10-20 reps per set,1-2 sets per day.   Copyright  VHI. All rights reserved.   Bridge    Lie back, legs bent. Inhale, pressing hips up. Keeping ribs in, lengthen lower back. Exhale, rolling down along spine from top. Repeat 10-20  times. Do 1-2 sessions per day.  http://pm.exer.us/55   Copyright  VHI. All rights reserved.

## 2015-09-28 NOTE — Therapy (Signed)
[facility] [address] [place], [place], [address] Phone: [phone]   Fax:  [phone]  Physical Therapy Evaluation  Patient Details  Name: Gabrielle Rodriguez MRN: [id number] Date of Birth: [birth year] Referring Provider: Romeo Apple   Encounter Date: 09/27/2015      PT End of Session - 09/28/15 0830    Visit Number 1   Number of Visits 20   Date for PT Re-Evaluation 10/27/15   Authorization Type [employer]- 8 visits approved, must call for more if needed.    Authorization Time Period 09/27/15-11/27/15   Authorization - Visit Number 1   Authorization - Number of Visits 8   PT Start Time 1303   PT Stop Time 1351   PT Time Calculation (min) 48 min   Activity Tolerance Patient tolerated treatment well;No increased pain  Improved pain and resolved feelings of tightness.    Behavior During Therapy [facility] for tasks assessed/performed      Past Medical History  Diagnosis Date  . SVD (spontaneous vaginal delivery)     x 1    Past Surgical History  Procedure Laterality Date  . Eye surgery      eye surgery at age 82 yrs  . Cesarean section      x 1  . Wisdom tooth extraction    . Vaginal hysterectomy N/A 07/29/2013    Procedure: HYSTERECTOMY VAGINAL with McCalls;  Surgeon: Genia Del, MD;  Location: [facility];  Service: Gynecology;  Laterality: N/A;  . Cystocele repair N/A 07/29/2013    Procedure: ANTERIOR REPAIR (CYSTOCELE);  Surgeon: Genia Del, MD;  Location: [facility];  Service: Gynecology;  Laterality: N/A;  . Colonoscopy N/A 11/20/2014    Procedure: COLONOSCOPY;  Surgeon: West Bali, MD;  Location: [facility];  Service: Endoscopy;  Laterality: N/A;  1:30 PM - moved to 1/11 @ 11:30 - Doris to notify pt  . Knee arthroscopy with medial menisectomy Left 09/21/2015    Procedure: KNEE ARTHROSCOPY WITH PARTIAL MEDIAL MENISECTOMY;  Surgeon: Vickki Hearing, MD;  Location: [facility];  Service: Orthopedics;  Laterality: Left;     There were no vitals filed for this visit.  Visit Diagnosis:  S/P left knee arthroscopy - Plan: PT plan of care cert/re-cert  Weakness of left leg - Plan: PT plan of care cert/re-cert  Knee stiff, left - Plan: PT plan of care cert/re-cert  Knee effusion, left - Plan: PT plan of care cert/re-cert  Unsteadiness on feet - Plan: PT plan of care cert/re-cert      Subjective Assessment - 09/27/15 1307    Subjective Pt reports chronic degerative pain L knee, likely worse while recovering from R knww surgery. Underwent L knee arthroscopy on 09/21/15   Pertinent History Partial ACL tear on R, rehab. Pain and dysfucntion in L knee for a couple years.    Limitations Sitting;Standing;Walking   How long can you sit comfortably? 2-3 minutes   How long can you stand comfortably? 5-7 minutes   How long can you walk comfortably? Has not tried much, household distances tolerated well with 1 cruch on R.    Patient Stated Goals Return to regular physical activity inculding but not limited to gold, walking, gym workuots, walking on uneven terrain. Return to work.     Currently in Pain? Yes   Pain Score 4    Pain Location Knee   Pain Orientation Left   Pain Descriptors / Indicators Sore   Pain Type Surgical pain  Aggravating Factors  knee extension, weightbearing.    Pain Relieving Factors Icing, pain meds PRN.           Edema: Swelling into the lateral compartment with mild paresthesia over the lateral aspect of the leg. Some edema into the ankle and foot, mostly consolidated below the ankle joint.   Posture: WNL  Functional Mobility: Modified independent for all mobility, using a single axillary crutch for ambulation  Strength: MMT reveals 5/5 strength throughout. L knee extension/flexion, resisted rotation not tested;   Soft Tissue Assessment: Knee and and hip musculature soft tissue assessed: WNL  Joint ROM:  R Knee 0-140; L knee 9-106 flexion L/R supine hip Rot  30/60  Neurological Testing:  Mild numbness in the proximal lateral compartment on left, which patient says was initially worse, but has been improving.                       PT Education - 09/28/15 [phone]    Education provided Yes   Education Details Explained the role of regular stretching and A/ROM after prolonged periods to help loosen knee prior to inittiation of gait/activity.    Person(s) Educated Patient   Methods Explanation   Comprehension Verbalized understanding;Returned demonstration          PT Short Term Goals - 09/28/15 1125    PT SHORT TERM GOAL #1   Title Pt will demonstrate independence in beginning home exercise program by twos weeks after commencement of therapy, to affirm self-efficacy in work at home to making progress toward goals.   PT SHORT TERM GOAL #2   Title After 2 weeks, pt will describe in detail 3 ways to manage exacerbation of pain, swelling, and stiffness at home to demonstrate greater self-efficacy in self-management of wellness and function.    PT SHORT TERM GOAL #3   Title Pt will ambulate 849ft with LRAD in 6 minutes c less than 2/10 pain after 4 weeks, to demonstrate improved activity tolerance and improved independence in limited community distance ambulation.    PT SHORT TERM GOAL #4   Title After 3 weeks, patient will demonstrate improved functional strength as seen in 5x sit to stand in less than 10 seconds without use of hands or assistive device and with near symmetrical loading.            PT Long Term Goals - 09/28/15 1129    PT LONG TERM GOAL #1   Title Pt will demonstrate independence in advanced home exercise program by 1 week prior to discharge, to further self-efficacy in continuation of progress toward goals after discharge from therapy.    PT LONG TERM GOAL #2   Title After 6 weeks, pt will demonstrate improved stability in LLE as demonstrated by ability to withstand single leg stand for 60 seconds without LOB.     PT LONG TERM GOAL #3   Title Pt will ambulate 2083ft with LRAD in 6 minutes c less than 2/10 pain after 4 weeks, to demonstrate improved activity tolerance and improved independence in limited community distance ambulation.    PT LONG TERM GOAL #4   Title After 3 weeks, patient will demonstrate improved functional strength as seen in 5x sit to stand in less than 8 seconds without use of hands or assistive device and with near symmetrical loading.                Plan - 09/28/15 0833    Clinical Impression Statement Pt presenting  6 days s/p L knee arthroscopy. Pt demonstratign antalgic gait with minimally impaired balance, pt reporting a chronic history of L knee instability, especially on uneven terrain. Light touch sensation was initially impaired over the proximal half of the lateral compartment, but has been gradually improving with only mild paresthesia at this time. Pt demonstrating less than typical post surgical edema, with no apparent erythema, and good healing of wounds. Strength testing reveals excellent strength throughout BLE with post surgical weakness and inhibition of the L quads. ROM in the L knee is 9- 106 degrees. Functional mobility is being performed modified independence, demonstrating safe adn fluent axillary crutch use. Pt will beneft from skilled PT services to address the aforementioned impairments, to address limitations in ambulation, work, and leisure activities to restore patient to PLOF.    Pt will benefit from skilled therapeutic intervention in order to improve on the following deficits Abnormal gait;Decreased coordination;Decreased range of motion;Difficulty walking;Decreased activity tolerance;Decreased balance;Decreased mobility;Decreased strength;Decreased skin integrity;Decreased scar mobility;Hypomobility;Increased edema;Impaired sensation;Increased muscle spasms;Pain   Rehab Potential Excellent   Clinical Impairments Affecting Rehab Potential chronic knee  instability   PT Frequency 3x / week   PT Duration 8 weeks   PT Treatment/Interventions Balance training;ADLs/Self Care Home Management;Electrical Stimulation;Moist Heat;Therapeutic exercise;Therapeutic activities;Functional mobility training;Gait training;Patient/family education;Manual techniques;Taping;Energy conservation;Passive range of motion;Scar mobilization;Manual lymph drainage   PT Next Visit Plan TUG, 2MWT, 5x Sit to standl; review HEP, add in SAQ, LAQ, and hip abduction; begin simple balance training on even firm surfaces.    PT Home Exercise Plan Heel slides, quad sets, SLR,bridging.    Consulted and Agree with Plan of Care Patient         Problem List Patient Active Problem List   Diagnosis Date Noted  . Medial meniscus, posterior horn derangement   . Special screening for malignant neoplasms, colon   . ACL laxity 07/24/2011  . Knee pain 07/24/2011    Jim Lundin C 09/28/2015, 11:41 AM  11:41 AM  Rosamaria LintsAllan C Damesha Lawler, PT, DPT [place] License # [id number]       [facility] [address] [place], [place], [address] Phone: [phone]   Fax:  [phone]  Name: Gabrielle Rodriguez MRN: [id number] Date of Birth: 1961/01/27

## 2015-09-28 NOTE — Therapy (Signed)
[facility] [address] [place], [place], [address] Phone: [phone]   Fax:  [phone]  Physical Therapy Treatment  Patient Details  Name: Gabrielle Rodriguez MRN: [id number] Date of Birth: Apr 05, 1961 Referring Provider: Fuller Canada MD  Encounter Date: 09/28/2015      PT End of Session - 09/28/15 1558    Visit Number 2   Number of Visits 20   Date for PT Re-Evaluation 10/27/15   Authorization Type BCBS- 8 visits approved, must call for more if needed.    Authorization Time Period 09/27/15-11/27/15   Authorization - Visit Number 2   Authorization - Number of Visits 8   PT Start Time 1520   PT Stop Time 1558   PT Time Calculation (min) 38 min   Activity Tolerance Patient tolerated treatment well;No increased pain   Behavior During Therapy [facility] for tasks assessed/performed      Past Medical History  Diagnosis Date  . SVD (spontaneous vaginal delivery)     x 1    Past Surgical History  Procedure Laterality Date  . Eye surgery      eye surgery at age 32 yrs  . Cesarean section      x 1  . Wisdom tooth extraction    . Vaginal hysterectomy N/A 07/29/2013    Procedure: HYSTERECTOMY VAGINAL with McCalls;  Surgeon: Genia Del, MD;  Location: [facility];  Service: Gynecology;  Laterality: N/A;  . Cystocele repair N/A 07/29/2013    Procedure: ANTERIOR REPAIR (CYSTOCELE);  Surgeon: Genia Del, MD;  Location: [facility];  Service: Gynecology;  Laterality: N/A;  . Colonoscopy N/A 11/20/2014    Procedure: COLONOSCOPY;  Surgeon: West Bali, MD;  [facility]: [facility];  Service: Endoscopy;  Laterality: N/A;  1:30 PM - moved to 1/11 @ 11:30 - Gabrielle Rodriguez to notify pt  . Knee arthroscopy with medial menisectomy Left 09/21/2015    Procedure: KNEE ARTHROSCOPY WITH PARTIAL MEDIAL MENISECTOMY;  Surgeon: Vickki Hearing, MD;  Location: [facility];  Service: Orthopedics;  Laterality: Left;    There were no vitals filed for this  visit.  Visit Diagnosis:  S/P left knee arthroscopy  Weakness of left leg  Knee stiff, left  Knee effusion, left  Unsteadiness on feet      Subjective Assessment - 09/28/15 1527    Subjective Pt reports knee stiff today, pain scale 3/10.  Has completed some of the HEP exercises explained last session.   Currently in Pain? Yes   Pain Score 3    Pain Location Knee   Pain Orientation Left   Pain Descriptors / Indicators Sore;Tightness            [facility] PT Assessment - 09/28/15 0001    Assessment   Medical Diagnosis L knee    Referring Provider Fuller Canada MD   Onset Date/Surgical Date 09/21/15   Hand Dominance Right   Next MD Visit 10/09/15   Prior Therapy [facility] at [facility]   Precautions   Precautions None   Transfers   Five time sit to stand comments  13.96" no HHA   Ambulation/Gait   Gait Comments TUG 10.75"   6 Minute Walk- Baseline   6 Minute Walk- Baseline yes  1075 feet   6 minute walk test results    Aerobic Endurance Distance Walked 1075   Endurance additional comments Ambulated with 1 crutch   Balance   Balance Assessed Yes  [facility] Adult PT Treatment/Exercise - 09/28/15 0001    Knee/Hip Exercises: Stretches   Active Hamstring Stretch Both;3 reps;30 seconds   Active Hamstring Stretch Limitations 12in ste;   Gastroc Stretch 3 reps;30 seconds   Gastroc Stretch Limitations slant board   Knee/Hip Exercises: Supine   Quad Sets Left;10 reps   Short Arc Quad Sets 10 reps   Heel Slides Left;15 reps   Heel Slides Limitations 5 sec stretch   Bridges 10 reps   Straight Leg Raises 10 reps   Knee/Hip Exercises: Sidelying   Hip ABduction 10 reps           PT Education - 09/28/15 1601    Education provided Yes   Education Details Reviewed goals, established HEP   Person(s) Educated Patient   Methods Explanation;Handout   Comprehension Verbalized understanding;Returned demonstration          PT Short Term Goals -  09/28/15 1125    PT SHORT TERM GOAL #1   Title Pt will demonstrate independence in beginning home exercise program by twos weeks after commencement of therapy, to affirm self-efficacy in work at home to making progress toward goals.   PT SHORT TERM GOAL #2   Title After 2 weeks, pt will describe in detail 3 ways to manage exacerbation of pain, swelling, and stiffness at home to demonstrate greater self-efficacy in self-management of wellness and function.    PT SHORT TERM GOAL #3   Title Pt will ambulate 86300ft with LRAD in 6 minutes c less than 2/10 pain after 4 weeks, to demonstrate improved activity tolerance and improved independence in limited community distance ambulation.    PT SHORT TERM GOAL #4   Title After 3 weeks, patient will demonstrate improved functional strength as seen in 5x sit to stand in less than 10 seconds without use of hands or assistive device and with near symmetrical loading.            PT Long Term Goals - 09/28/15 1129    PT LONG TERM GOAL #1   Title Pt will demonstrate independence in advanced home exercise program by 1 week prior to discharge, to further self-efficacy in continuation of progress toward goals after discharge from therapy.    PT LONG TERM GOAL #2   Title After 6 weeks, pt will demonstrate improved stability in LLE as demonstrated by ability to withstand single leg stand for 60 seconds without LOB.    PT LONG TERM GOAL #3   Title Pt will ambulate 202200ft with LRAD in 6 minutes c less than 2/10 pain after 4 weeks, to demonstrate improved activity tolerance and improved independence in limited community distance ambulation.    PT LONG TERM GOAL #4   Title After 3 weeks, patient will demonstrate improved functional strength as seen in 5x sit to stand in less than 8 seconds without use of hands or assistive device and with near symmetrical loading.                Plan - 09/28/15 1601    Clinical Impression Statement Reviewed goals,  established HEP and copy of evaluation given.  Physical performance testing complete for gait and speed with 1 crutches.  Verbal cueing for heel to toe pattern and appropriate sequence with crutch and equal stride length to improve gait mechanics.  Ended session with LE strengthening with good form noted following demonstration and/or cueing for technique. Pt improving AROM 1-125 degrees at end of session.  Pt reported pain scale 0/10  at end of session, encouraged pt to apply ice for pain and edema control following treatment.     PT Next Visit Plan Continue with current PT POC for functional strengthening beginning standing next session and simple balance training on even firm surfaces initially.   PT Home Exercise Plan Heel slides, quad sets, SLR,bridging.         Problem List Patient Active Problem List   Diagnosis Date Noted  . Medial meniscus, posterior horn derangement   . Special screening for malignant neoplasms, colon   . ACL laxity 07/24/2011  . Knee pain 07/24/2011   Becky Sax, LPTA; CBIS [phone]  Juel Burrow 09/28/2015, 4:46 PM   [facility] [address] [place], [place], [address] Phone: [phone]   Fax:  [phone]  Name: Gabrielle Rodriguez MRN: [id number] Date of Birth: [birth year]

## 2015-10-02 ENCOUNTER — Ambulatory Visit (HOSPITAL_COMMUNITY): Payer: BLUE CROSS/BLUE SHIELD

## 2015-10-02 DIAGNOSIS — M25662 Stiffness of left knee, not elsewhere classified: Secondary | ICD-10-CM

## 2015-10-02 DIAGNOSIS — R2681 Unsteadiness on feet: Secondary | ICD-10-CM

## 2015-10-02 DIAGNOSIS — Z9889 Other specified postprocedural states: Secondary | ICD-10-CM

## 2015-10-02 DIAGNOSIS — R29898 Other symptoms and signs involving the musculoskeletal system: Secondary | ICD-10-CM

## 2015-10-02 DIAGNOSIS — M25462 Effusion, left knee: Secondary | ICD-10-CM

## 2015-10-02 NOTE — Therapy (Signed)
[facility] [address] [place], [place], [address] Phone: [phone]   Fax:  [phone]  Physical Therapy Treatment  Patient Details  Name: Gabrielle Rodriguez MRN: [id number] Date of Birth: 1961-04-17 Referring Provider: Fuller Canada MD  Encounter Date: 10/02/2015      PT End of Session - 10/02/15 1550    Visit Number 3   Number of Visits 20   Date for PT Re-Evaluation 10/27/15   Authorization Type BCBS- 8 visits approved, must call for more if needed.    Authorization Time Period 09/27/15-11/27/15   Authorization - Visit Number 3   Authorization - Number of Visits 8   PT Start Time 1516   PT Stop Time 1600   PT Time Calculation (min) 44 min   Activity Tolerance Patient tolerated treatment well;No increased pain   Behavior During Therapy [facility] for tasks assessed/performed      Past Medical History  Diagnosis Date  . SVD (spontaneous vaginal delivery)     x 1    Past Surgical History  Procedure Laterality Date  . Eye surgery      eye surgery at age 72 yrs  . Cesarean section      x 1  . Wisdom tooth extraction    . Vaginal hysterectomy N/A 07/29/2013    Procedure: HYSTERECTOMY VAGINAL with McCalls;  Surgeon: Genia Del, MD;  Location: [facility];  Service: Gynecology;  Laterality: N/A;  . Cystocele repair N/A 07/29/2013    Procedure: ANTERIOR REPAIR (CYSTOCELE);  Surgeon: Genia Del, MD;  Location: [facility];  Service: Gynecology;  Laterality: N/A;  . Colonoscopy N/A 11/20/2014    Procedure: COLONOSCOPY;  Surgeon: West Bali, MD;  [facility];  Service: Endoscopy;  Laterality: N/A;  1:30 PM - moved to 1/11 @ 11:30 - Doris to notify pt  . Knee arthroscopy with medial menisectomy Left 09/21/2015    Procedure: KNEE ARTHROSCOPY WITH PARTIAL MEDIAL MENISECTOMY;  Surgeon: Vickki Hearing, MD;  Location: [facility];  Service: Orthopedics;  Laterality: Left;    There were no vitals filed for this  visit.  Visit Diagnosis:  S/P left knee arthroscopy  Weakness of left leg  Knee stiff, left  Knee effusion, left  Unsteadiness on feet      Subjective Assessment - 10/02/15 1515    Subjective Pt stated knee is stiff today, pain scale 3/10.  Pt entered into dept ambulating wth no AD   Currently in Pain? Yes   Pain Score 3    Pain Location Knee   Pain Orientation Left   Pain Descriptors / Indicators Tightness;Sore           [facility] Adult PT Treatment/Exercise - 10/02/15 0001    Knee/Hip Exercises: Stretches   Active Hamstring Stretch Both;3 reps;30 seconds   Active Hamstring Stretch Limitations 12in ste;   Quad Stretch Left;1 rep;30 seconds   Quad Stretch Limitations c/o scar pain, unable to feel stretch on quad   Gastroc Stretch 3 reps;30 seconds   Gastroc Stretch Limitations slant board   Knee/Hip Exercises: Standing   Forward Lunges Both;10 reps   Forward Lunges Limitations 6in step   Functional Squat 10 reps   Functional Squat Limitations front of mat cueing for weight loading   Rocker Board 2 minutes   Rocker Board Limitations R/L and A/P   SLS Rt 60", Lt 44" max of 2   Gait Training Gait training no AD x 226 feet with cueing for increased  stance phase Lt LE   Other Standing Knee Exercises tandem stance 2x 30", tandem gait 1RT   Knee/Hip Exercises: Seated   Sit to Sand 10 reps   Knee/Hip Exercises: Supine   Short Arc Quad Sets 15 reps   Heel Slides Left;15 reps   Heel Slides Limitations 5 sec stretch   Terminal Knee Extension 15 reps   Terminal Knee Extension Limitations 15x 5"   Straight Leg Raises 15 reps                  PT Short Term Goals - 10/02/15 1547    PT SHORT TERM GOAL #1   Title Pt will demonstrate independence in beginning home exercise program by twos weeks after commencement of therapy, to affirm self-efficacy in work at home to making progress toward goals.   Status Achieved   PT SHORT TERM GOAL #2   Title After 2 weeks, pt will  describe in detail 3 ways to manage exacerbation of pain, swelling, and stiffness at home to demonstrate greater self-efficacy in self-management of wellness and function.    Status On-going   PT SHORT TERM GOAL #3   Title Pt will ambulate 8700ft with LRAD in 6 minutes c less than 2/10 pain after 4 weeks, to demonstrate improved activity tolerance and improved independence in limited community distance ambulation.    PT SHORT TERM GOAL #4   Title After 3 weeks, patient will demonstrate improved functional strength as seen in 5x sit to stand in less than 10 seconds without use of hands or assistive device and with near symmetrical loading.            PT Long Term Goals - 10/02/15 1549    PT LONG TERM GOAL #1   Title Pt will demonstrate independence in advanced home exercise program by 1 week prior to discharge, to further self-efficacy in continuation of progress toward goals after discharge from therapy.    PT LONG TERM GOAL #2   Title After 6 weeks, pt will demonstrate improved stability in LLE as demonstrated by ability to withstand single leg stand for 60 seconds without LOB.    Status On-going   PT LONG TERM GOAL #3   Title Pt will ambulate 204200ft with LRAD in 6 minutes c less than 2/10 pain after 4 weeks, to demonstrate improved activity tolerance and improved independence in limited community distance ambulation.    PT LONG TERM GOAL #4   Title After 3 weeks, patient will demonstrate improved functional strength as seen in 5x sit to stand in less than 8 seconds without use of hands or assistive device and with near symmetrical loading.                Plan - 10/02/15 1550    Clinical Impression Statement Pt progressing well evidenced by improvement of AROM (0-137) and entered dept ambulating with no AD.  Session focus on improving gait mechanics with cueing to improve stance phase for Lt LE.  Began rockerboard to improve weight distibution and balance activities.  Functional  strengthening exercises complete per pt tolerance with no reports of increased pain at end of session.  Noted significant adhesions anterior knee, pt educated on scar tissue massage techniques though incision not fully healed so no manual directly over incision.  Pt encouraged to apply ice at end of session for pain and edema control.     PT Next Visit Plan Continue with current PT POC for functional strengthening, gait training and  balance.  Continue with rockerboard for weight distibution with gait.          Problem List Patient Active Problem List   Diagnosis Date Noted  . Medial meniscus, posterior horn derangement   . Special screening for malignant neoplasms, colon   . ACL laxity 07/24/2011  . Knee pain 07/24/2011   Becky Sax, LPTA; CBIS [phone]  Juel Burrow 10/02/2015, 4:04 PM  [facility] [address] [place], [place], [address] Phone: [phone]   Fax:  [phone]  Name: DEAMBER BUCKHALTER MRN: [id number] Date of Birth: Jan 22, 1961

## 2015-10-03 ENCOUNTER — Ambulatory Visit (HOSPITAL_COMMUNITY): Payer: BLUE CROSS/BLUE SHIELD

## 2015-10-03 DIAGNOSIS — M25462 Effusion, left knee: Secondary | ICD-10-CM

## 2015-10-03 DIAGNOSIS — R29898 Other symptoms and signs involving the musculoskeletal system: Secondary | ICD-10-CM

## 2015-10-03 DIAGNOSIS — Z9889 Other specified postprocedural states: Secondary | ICD-10-CM | POA: Diagnosis not present

## 2015-10-03 DIAGNOSIS — M25662 Stiffness of left knee, not elsewhere classified: Secondary | ICD-10-CM

## 2015-10-03 DIAGNOSIS — R2681 Unsteadiness on feet: Secondary | ICD-10-CM

## 2015-10-03 NOTE — Therapy (Addendum)
PHYSICAL THERAPY DISCHARGE SUMMARY  Visits from Start of Care: 4  Current functional level related to goals / functional outcomes: *see below   Remaining deficits: *see below   Education / Equipment: *see below  Plan:                                                    Patient goals were not met. Patient is being discharged due to not returning since the last visit.  ?????    5:01 PM, 01/02/2016 Etta Grandchild, PT, DPT PRN Physical Therapist at [place] # [id number] [phone] (office)      [place], [place], [address] Phone: [phone]   Fax:  [phone]  Physical Therapy Treatment  Patient Details  Name: Gabrielle Rodriguez MRN: [id number] Date of Birth: 19-Jul-1961 Referring Provider: Arther Abbott MD  Encounter Date: 10/03/2015      PT End of Session - 10/03/15 1344    Visit Number 4   Number of Visits 20   Date for PT Re-Evaluation 10/27/15   Authorization Type BCBS- 8 visits approved, must call for more if needed.    Authorization Time Period 09/27/15-11/27/15   Authorization - Visit Number 4   Authorization - Number of Visits 8   PT Start Time 8891   PT Stop Time 1343   PT Time Calculation (min) 38 min   Activity Tolerance Patient tolerated treatment well;No increased pain   Behavior During Therapy [facility] for tasks assessed/performed      Past Medical History  Diagnosis Date  . SVD (spontaneous vaginal delivery)     x 1    Past Surgical History  Procedure Laterality Date  . Eye surgery      eye surgery at age 68 yrs  . Cesarean section      x 1  . Wisdom tooth extraction    . Vaginal hysterectomy N/A 07/29/2013    Procedure: HYSTERECTOMY VAGINAL with McCalls;  Surgeon: Princess Bruins, MD;  Location: [facility];  Service: Gynecology;  Laterality: N/A;  . Cystocele repair N/A 07/29/2013    Procedure: ANTERIOR REPAIR (CYSTOCELE);  Surgeon: Princess Bruins, MD;   Location: [facility];  Service: Gynecology;  Laterality: N/A;  . Colonoscopy N/A 11/20/2014    Procedure: COLONOSCOPY;  Surgeon: Danie Binder, MD;  [facility];  Service: Endoscopy;  Laterality: N/A;  1:30 PM - moved to 1/11 @ 11:30 - Doris to notify pt  . Knee arthroscopy with medial menisectomy Left 09/21/2015    Procedure: KNEE ARTHROSCOPY WITH PARTIAL MEDIAL MENISECTOMY;  Surgeon: Carole Civil, MD;  Location: [facility];  Service: Orthopedics;  Laterality: Left;    There were no vitals filed for this visit.  Visit Diagnosis:  S/P left knee arthroscopy  Weakness of left leg  Knee stiff, left  Knee effusion, left  Unsteadiness on feet      Subjective Assessment - 10/03/15 1310    Subjective Pt stated knee is mainly stiff today, pain scale 3/10.  Pt reported she has been without crutches the last 2 days.   Currently in Pain? Yes   Pain Score 3    Pain Location Knee   Pain Orientation Left   Pain Descriptors / Indicators Tightness  [facility] Adult PT Treatment/Exercise - 10/03/15 0001    Knee/Hip Exercises: Standing   Forward Lunges Both;15 reps   Forward Lunges Limitations 6in step   Side Lunges Both;10 reps   Side Lunges Limitations 6in step   Terminal Knee Extension Limitations 10x 5" hold with RTB   Lateral Step Up Step Height: 4";Step Height: 2"   Lateral Step Up Limitations unable today   Forward Step Up Left;10 reps;Hand Hold: 0;Step Height: 4"   Functional Squat 10 reps   Functional Squat Limitations front of mat cueing for weight loading   Rocker Board 2 minutes   Rocker Board Limitations R/L and A/P   SLS Lt 60", Rt 50" on Airex   Gait Training 226 feet no AD   Other Standing Knee Exercises tandem stance 2x 30" on airex, tandem gait 1RT   Other Standing Knee Exercises sidestepping RTB 1 RT   Knee/Hip Exercises: Seated   Sit to Sand 10 reps;without UE support                  PT Short Term Goals - 10/02/15 1547    PT SHORT  TERM GOAL #1   Title Pt will demonstrate independence in beginning home exercise program by twos weeks after commencement of therapy, to affirm self-efficacy in work at home to making progress toward goals.   Status Achieved   PT SHORT TERM GOAL #2   Title After 2 weeks, pt will describe in detail 3 ways to manage exacerbation of pain, swelling, and stiffness at home to demonstrate greater self-efficacy in self-management of wellness and function.    Status On-going   PT SHORT TERM GOAL #3   Title Pt will ambulate 877f with LRAD in 6 minutes c less than 2/10 pain after 4 weeks, to demonstrate improved activity tolerance and improved independence in limited community distance ambulation.    PT SHORT TERM GOAL #4   Title After 3 weeks, patient will demonstrate improved functional strength as seen in 5x sit to stand in less than 10 seconds without use of hands or assistive device and with near symmetrical loading.            PT Long Term Goals - 10/02/15 1549    PT LONG TERM GOAL #1   Title Pt will demonstrate independence in advanced home exercise program by 1 week prior to discharge, to further self-efficacy in continuation of progress toward goals after discharge from therapy.    PT LONG TERM GOAL #2   Title After 6 weeks, pt will demonstrate improved stability in LLE as demonstrated by ability to withstand single leg stand for 60 seconds without LOB.    Status On-going   PT LONG TERM GOAL #3   Title Pt will ambulate 20075fwith LRAD in 6 minutes c less than 2/10 pain after 4 weeks, to demonstrate improved activity tolerance and improved independence in limited community distance ambulation.    PT LONG TERM GOAL #4   Title After 3 weeks, patient will demonstrate improved functional strength as seen in 5x sit to stand in less than 8 seconds without use of hands or assistive device and with near symmetrical loading.                Plan - 10/03/15 1344    Clinical Impression  Statement Session focus on gait training, functional strengthening and balance training.  Pt improving gait mechanics with no cueing required for equal stance phase and heel to toe gait pattern.  Progressed functional strengthening with side lunges and began stair training.  Pt unable to complete lateral step up due to quad weakness, cueing required with forward step ups to reduce compensation with Rt foot push off.Mayra Neer improving with 60" SLS on solid and dynamic surfaces.  Progressed to Airex and balance beam with supervision only required.  End of session pt reports pain reduced.   PT Next Visit Plan Continue with current PT POC for functional strengthening, gait training and balance.  Progress with stair training, try lateral step ups next session and begin vector stance.        Problem List Patient Active Problem List   Diagnosis Date Noted  . Medial meniscus, posterior horn derangement   . Special screening for malignant neoplasms, colon   . ACL laxity 07/24/2011  . Knee pain 07/24/2011   Ihor Austin, LPTA; CBIS [phone]  Aldona Lento 10/03/2015, 1:53 PM  [place], [place], [address] Phone: [phone]   Fax:  [phone]  Name: Gabrielle Rodriguez MRN: [id number] Date of Birth: 24-Jan-1961

## 2015-10-09 ENCOUNTER — Encounter: Payer: Self-pay | Admitting: Orthopedic Surgery

## 2015-10-09 ENCOUNTER — Telehealth (HOSPITAL_COMMUNITY): Payer: Self-pay

## 2015-10-09 ENCOUNTER — Ambulatory Visit (INDEPENDENT_AMBULATORY_CARE_PROVIDER_SITE_OTHER): Payer: BLUE CROSS/BLUE SHIELD | Admitting: Orthopedic Surgery

## 2015-10-09 ENCOUNTER — Ambulatory Visit (HOSPITAL_COMMUNITY): Payer: BLUE CROSS/BLUE SHIELD

## 2015-10-09 ENCOUNTER — Telehealth (HOSPITAL_COMMUNITY): Payer: Self-pay | Admitting: Physical Therapy

## 2015-10-09 DIAGNOSIS — Z4789 Encounter for other orthopedic aftercare: Secondary | ICD-10-CM

## 2015-10-09 DIAGNOSIS — Z9889 Other specified postprocedural states: Secondary | ICD-10-CM

## 2015-10-09 MED ORDER — HYDROCODONE-ACETAMINOPHEN 7.5-325 MG PO TABS: 1.0000 | ORAL_TABLET | Freq: Four times a day (QID) | ORAL | Status: DC | PRN

## 2015-10-09 NOTE — Telephone Encounter (Signed)
Having car trouble. NF

## 2015-10-09 NOTE — Progress Notes (Signed)
Follow-up postop visit status post left knee arthroscopy partial medial meniscus  She wants to go back to work on the 30th  Her knee looks good she does still have a small amount of swelling but she can flex and knee 125 she has a dull aching pain for which he takes ibuprofen  She should finish therapy can take hydrocodone for pain see her again in 4 weeks he can return to work on the 30th

## 2015-10-09 NOTE — Telephone Encounter (Signed)
No show, called and spoke to pt who stated she had already called and left a message that she would be unable to make it to apt.   Pt informed of openings later today and next scheduled apt date and time.  [address] Cockerham, LPTA; CBIS [phone]

## 2015-10-10 ENCOUNTER — Encounter (HOSPITAL_COMMUNITY): Payer: BLUE CROSS/BLUE SHIELD | Admitting: Physical Therapy

## 2015-10-12 ENCOUNTER — Ambulatory Visit (HOSPITAL_COMMUNITY): Payer: BLUE CROSS/BLUE SHIELD | Attending: Orthopedic Surgery

## 2015-10-12 ENCOUNTER — Telehealth (HOSPITAL_COMMUNITY): Payer: Self-pay

## 2015-10-12 NOTE — Telephone Encounter (Signed)
No show, called and left message informing pt of missed apt, included next apt date and time and contact info.  [address] K[address]ia Thomason, LPTA; CBIS [phone]

## 2015-10-16 ENCOUNTER — Ambulatory Visit (HOSPITAL_COMMUNITY): Payer: BLUE CROSS/BLUE SHIELD

## 2015-10-17 ENCOUNTER — Telehealth (HOSPITAL_COMMUNITY): Payer: Self-pay

## 2015-10-17 ENCOUNTER — Ambulatory Visit (HOSPITAL_COMMUNITY): Payer: BLUE CROSS/BLUE SHIELD

## 2015-10-17 NOTE — Telephone Encounter (Signed)
Pt left message cancelling apt for 10/16/2015, assuming she meant 10/17/2015.  Called and left message on voice mail.  [address] Cockerham, LPTA; CBIS [phone]

## 2015-10-19 ENCOUNTER — Ambulatory Visit (HOSPITAL_COMMUNITY): Payer: BLUE CROSS/BLUE SHIELD

## 2015-10-23 ENCOUNTER — Encounter (HOSPITAL_COMMUNITY): Payer: BLUE CROSS/BLUE SHIELD | Admitting: Physical Therapy

## 2015-11-06 ENCOUNTER — Ambulatory Visit (INDEPENDENT_AMBULATORY_CARE_PROVIDER_SITE_OTHER): Payer: BLUE CROSS/BLUE SHIELD | Admitting: Orthopedic Surgery

## 2015-11-06 VITALS — BP 157/90 | Ht 66.0 in | Wt 150.0 lb

## 2015-11-06 DIAGNOSIS — Z4789 Encounter for other orthopedic aftercare: Secondary | ICD-10-CM

## 2015-11-06 DIAGNOSIS — Z9889 Other specified postprocedural states: Secondary | ICD-10-CM

## 2015-11-06 NOTE — Progress Notes (Signed)
Patient ID: Gabrielle HolesJulia C Niemann, female   DOB: 1961/04/14, 54 y.o.   MRN: [id number]   POST OP VISIT  S/P KNEE ARTHROSCOPY  Chief Complaint  Patient presents with  . Follow-up    4 week follow up SALK, DOS 09/21/15     BP 157/90 mmHg  Ht 5\' 6"  (1.676 m)  Wt 150 lb (68.04 kg)  BMI 24.22 kg/m2  LMP 07/03/2011  The surgical sites look clean dry and intact.  The knee looks good straight leg raise is normal she does have a small effusion but no restriction in motion  ASSESSMENT AND PLAN   Follow-up as needed

## 2016-07-10 ENCOUNTER — Encounter: Payer: Self-pay | Admitting: Orthopedic Surgery

## 2016-07-10 ENCOUNTER — Ambulatory Visit (INDEPENDENT_AMBULATORY_CARE_PROVIDER_SITE_OTHER): Payer: BLUE CROSS/BLUE SHIELD | Admitting: Orthopedic Surgery

## 2016-07-10 VITALS — BP 133/76 | HR 63 | Ht 67.0 in | Wt 151.0 lb

## 2016-07-10 DIAGNOSIS — M129 Arthropathy, unspecified: Secondary | ICD-10-CM

## 2016-07-10 DIAGNOSIS — M23322 Other meniscus derangements, posterior horn of medial meniscus, left knee: Secondary | ICD-10-CM | POA: Diagnosis not present

## 2016-07-10 DIAGNOSIS — Z9889 Other specified postprocedural states: Secondary | ICD-10-CM

## 2016-07-10 DIAGNOSIS — M1712 Unilateral primary osteoarthritis, left knee: Secondary | ICD-10-CM

## 2016-07-10 NOTE — Progress Notes (Signed)
Patient ID: Gabrielle HolesJulia C Septer, female   DOB: 11/22/1960, 55 y.o.   MRN: [id number]  Chief Complaint  Patient presents with  . Follow-up    LEFT KNEE PAIN AND SWELLING, SALK 09/21/15    HPI Gabrielle Rodriguez is a 55 y.o. female.  Status post arthroscopy left knee complains of intermittent swelling HPI  She has been able to golf twice a week do most of her activities with occasional pain and swelling Review of Systems Review of Systems Normal neuro  Denies fever   Past Medical History:  Diagnosis Date  . SVD (spontaneous vaginal delivery)    x 1     Examination BP 133/76   Pulse 63   Ht 5\' 7"  (1.702 m)   Wt 151 lb (68.5 kg)   LMP 07/03/2011   BMI 23.65 kg/m   Gen. appearance the patient's appearance is normal with normal grooming and hygiene The patient is oriented to person place and time Mood and affect are normal Sensation is  Normal  Pulses are   normal  Ortho Exam Tenderness on lateral suprapatellar area and medial femoral condylewell and in the joint for range of motion knee stable McMurray sign negative  Medical decision-making  Diagnosis Encounter Diagnoses  Name Primary?  . S/P left knee arthroscopy   . Medial meniscus, posterior horn derangement, left   . Arthritis of left knee Yes     Data PROCEDURE:  Procedure(s):November 2016 KNEE ARTHROSCOPY WITH PARTIAL MEDIAL MENISECTOMY (Left)    Chondral changes throughout the knee primarily on the medial femoral condyle with grade 2 lesion. There was a posterior horn root tear of the medial meniscus. Mild synovitis was also noted in the knee. Anterior cruciate ligament and PCL were intact and lateral compartment was normal  Plan (risk)   Fuller CanadaStanley Danessa Mensch, MD 07/10/2016 5:14 PM

## 2016-07-10 NOTE — Patient Instructions (Signed)
Glucosamine and chondroitin sulfate  Available at [facility], [facility], [facility]

## 2016-08-28 ENCOUNTER — Other Ambulatory Visit (HOSPITAL_COMMUNITY): Payer: Self-pay | Admitting: Internal Medicine

## 2016-08-28 DIAGNOSIS — Z1231 Encounter for screening mammogram for malignant neoplasm of breast: Secondary | ICD-10-CM

## 2016-08-28 DIAGNOSIS — Z13820 Encounter for screening for osteoporosis: Secondary | ICD-10-CM

## 2016-09-15 ENCOUNTER — Ambulatory Visit (HOSPITAL_COMMUNITY)
Admission: RE | Admit: 2016-09-15 | Discharge: 2016-09-15 | Disposition: A | Discharge status: Home / Self Care | Payer: BLUE CROSS/BLUE SHIELD | Source: Ambulatory Visit | Attending: Internal Medicine | Admitting: Internal Medicine

## 2016-09-15 DIAGNOSIS — Z1231 Encounter for screening mammogram for malignant neoplasm of breast: Secondary | ICD-10-CM | POA: Insufficient documentation

## 2016-09-15 DIAGNOSIS — Z780 Asymptomatic menopausal state: Secondary | ICD-10-CM | POA: Diagnosis not present

## 2016-09-15 DIAGNOSIS — Z13820 Encounter for screening for osteoporosis: Secondary | ICD-10-CM | POA: Insufficient documentation

## 2017-01-05 ENCOUNTER — Encounter: Payer: BLUE CROSS/BLUE SHIELD | Admitting: Orthopedic Surgery

## 2017-01-05 ENCOUNTER — Other Ambulatory Visit: Payer: BLUE CROSS/BLUE SHIELD

## 2018-05-17 ENCOUNTER — Other Ambulatory Visit: Payer: Self-pay | Admitting: Obstetrics & Gynecology

## 2018-05-17 DIAGNOSIS — Z1231 Encounter for screening mammogram for malignant neoplasm of breast: Secondary | ICD-10-CM

## 2018-05-19 ENCOUNTER — Ambulatory Visit (HOSPITAL_COMMUNITY)
Admission: RE | Admit: 2018-05-19 | Discharge: 2018-05-19 | Disposition: A | Discharge status: Home / Self Care | Payer: BLUE CROSS/BLUE SHIELD | Source: Ambulatory Visit | Attending: Obstetrics & Gynecology | Admitting: Obstetrics & Gynecology

## 2018-05-19 ENCOUNTER — Other Ambulatory Visit: Payer: Self-pay | Admitting: Obstetrics & Gynecology

## 2018-05-19 DIAGNOSIS — R928 Other abnormal and inconclusive findings on diagnostic imaging of breast: Secondary | ICD-10-CM

## 2018-05-19 DIAGNOSIS — Z1231 Encounter for screening mammogram for malignant neoplasm of breast: Secondary | ICD-10-CM | POA: Insufficient documentation

## 2018-05-20 ENCOUNTER — Ambulatory Visit (INDEPENDENT_AMBULATORY_CARE_PROVIDER_SITE_OTHER): Payer: BLUE CROSS/BLUE SHIELD | Admitting: Obstetrics & Gynecology

## 2018-05-20 ENCOUNTER — Encounter: Payer: Self-pay | Admitting: Obstetrics & Gynecology

## 2018-05-20 VITALS — BP 120/70 | Ht 65.5 in | Wt 134.0 lb

## 2018-05-20 DIAGNOSIS — Z780 Asymptomatic menopausal state: Secondary | ICD-10-CM | POA: Diagnosis not present

## 2018-05-20 DIAGNOSIS — R928 Other abnormal and inconclusive findings on diagnostic imaging of breast: Secondary | ICD-10-CM | POA: Diagnosis not present

## 2018-05-20 DIAGNOSIS — Z90710 Acquired absence of both cervix and uterus: Secondary | ICD-10-CM | POA: Diagnosis not present

## 2018-05-20 DIAGNOSIS — Z01419 Encounter for gynecological examination (general) (routine) without abnormal findings: Secondary | ICD-10-CM | POA: Diagnosis not present

## 2018-05-20 NOTE — Progress Notes (Signed)
Gabrielle Rodriguez December 12, 1960 [id number]   History:    57 y.o. [id number] Married.  Her daughter in PT is my patient.  RP:  Established patient presenting for annual gyn exam   HPI: S/P Total Hysterectomy.  Menopause, well on no hormone replacement therapy.  Minimal hot flashes occasionally.  No pelvic pain.  No pain with intercourse.  Normal vaginal secretions.  Urine and bowel movements normal.  Body mass index 21.96.  Exercises regularly.  Had a screening mammogram yesterday with showed a possible mass on the left side.  A left diagnostic mammogram and ultrasound is scheduled for July 16.  Patient feels no lump and no pain.  No nipple discharge and no change in skin.  Health labs with family physician.  Past medical history,surgical history, family history and social history were all reviewed and documented in the EPIC chart.  Gynecologic History Patient's last menstrual period was 07/03/2011. Contraception: status post hysterectomy Last Pap: 2017. Results were: normal per patient, will obtain from [facility] Last mammogram: 05/19/2018. Results were: abnormal.  Left Dx mammo/US scheduled for 7/16th. Bone Density: 09/2016 Normal Colonoscopy: 2015  Obstetric History OB History  Gravida Para Term Preterm AB Living  4 2     2 2   SAB TAB Ectopic Multiple Live Births  2            # Outcome Date GA Lbr Len/2nd Weight Sex Delivery Anes PTL Lv  4 SAB           3 SAB           2 Para           1 Para              ROS: A ROS was performed and pertinent positives and negatives are included in the history.  GENERAL: No fevers or chills. HEENT: No change in vision, no earache, sore throat or sinus congestion. NECK: No pain or stiffness. CARDIOVASCULAR: No chest pain or pressure. No palpitations. PULMONARY: No shortness of breath, cough or wheeze. GASTROINTESTINAL: No abdominal pain, nausea, vomiting or diarrhea, melena or bright red blood per rectum. GENITOURINARY: No urinary frequency,  urgency, hesitancy or dysuria. MUSCULOSKELETAL: No joint or muscle pain, no back pain, no recent trauma. DERMATOLOGIC: No rash, no itching, no lesions. ENDOCRINE: No polyuria, polydipsia, no heat or cold intolerance. No recent change in weight. HEMATOLOGICAL: No anemia or easy bruising or bleeding. NEUROLOGIC: No headache, seizures, numbness, tingling or weakness. PSYCHIATRIC: No depression, no loss of interest in normal activity or change in sleep pattern.     Exam:   BP 120/70   Ht 5' 5.5" (1.664 m)   Wt 134 lb (60.8 kg)   LMP 07/03/2011   BMI 21.96 kg/m   Body mass index is 21.96 kg/m.  General appearance : Well developed well nourished female. No acute distress HEENT: Eyes: no retinal hemorrhage or exudates,  Neck supple, trachea midline, no carotid bruits, no thyroidmegaly Lungs: Clear to auscultation, no rhonchi or wheezes, or rib retractions  Heart: Regular rate and rhythm, no murmurs or gallops Breast:Examined in sitting and supine position were symmetrical in appearance, no palpable masses or tenderness,  no skin retraction, no nipple inversion, no nipple discharge, no skin discoloration, no axillary or supraclavicular lymphadenopathy Abdomen: no palpable masses or tenderness, no rebound or guarding Extremities: no edema or skin discoloration or tenderness  Pelvic: Vulva: Normal             Vagina:  No gross lesions or discharge.  Pap reflex done.  Cervix/Uterus absent  Adnexa  Without masses or tenderness  Anus: Normal   Assessment/Plan:  57 y.o. female for annual exam   1. Well female exam with routine gynecological exam Gynecologic exam status post total hysterectomy and menopause.  Pap reflex done on the vaginal vault today.  Breast exam within normal limits but possible mild increase in density at the left upper external quadrant.  Screening mammogram was abnormal yesterday showing a possible left breast mass.  Patient is scheduled for a left diagnostic mammogram with  ultrasound on July 16.  Colonoscopy done in 2015.  Health labs with family physician.  2. Hx of total hysterectomy  3. Menopause present Well on no hormone replacement therapy.  Recommend vitamin D supplements, calcium rich nutrition and regular weightbearing physical activity.  Bone density was normal in November 2017.  Patient will have her vitamin D level tested with her family physician.  4. Abnormality of left breast on screening mammogram Screening mammogram done May 19, 2018 showing a possible mass on the left breast.  Left diagnostic mammogram with ultrasound scheduled for July 16.  Gabrielle DelMarie-Lyne Miah Boye MD, 8:21 AM 05/20/2018

## 2018-05-20 NOTE — Patient Instructions (Signed)
1. Well female exam with routine gynecological exam Gynecologic exam status post total hysterectomy and menopause.  Pap reflex done on the vaginal vault today.  Breast exam within normal limits but possible mild increase in density at the left upper external quadrant.  Screening mammogram was abnormal yesterday showing a possible left breast mass.  Patient is scheduled for a left diagnostic mammogram with ultrasound on July 16.  Colonoscopy done in 2015.  Health labs with family physician.  2. Hx of total hysterectomy  3. Menopause present Well on no hormone replacement therapy.  Recommend vitamin D supplements, calcium rich nutrition and regular weightbearing physical activity.  Bone density was normal in November 2017.  Patient will have her vitamin D level tested with her family physician.  4. Abnormality of left breast on screening mammogram Screening mammogram done May 19, 2018 showing a possible mass on the left breast.  Left diagnostic mammogram with ultrasound scheduled for July 16.  Gabrielle Rodriguez, it was a pleasure seeing you today!  I will inform you of your results as soon as they are available.

## 2018-05-21 LAB — PAP IG W/ RFLX HPV ASCU

## 2018-05-25 ENCOUNTER — Ambulatory Visit (HOSPITAL_COMMUNITY)
Admission: RE | Admit: 2018-05-25 | Discharge: 2018-05-25 | Disposition: A | Discharge status: Home / Self Care | Payer: BLUE CROSS/BLUE SHIELD | Source: Ambulatory Visit | Attending: Obstetrics & Gynecology | Admitting: Obstetrics & Gynecology

## 2018-05-25 DIAGNOSIS — R928 Other abnormal and inconclusive findings on diagnostic imaging of breast: Secondary | ICD-10-CM | POA: Diagnosis present

## 2018-05-27 ENCOUNTER — Encounter: Payer: Self-pay | Admitting: Anesthesiology

## 2019-05-11 ENCOUNTER — Other Ambulatory Visit (HOSPITAL_COMMUNITY): Payer: Self-pay | Admitting: Obstetrics & Gynecology

## 2019-05-11 DIAGNOSIS — Z1231 Encounter for screening mammogram for malignant neoplasm of breast: Secondary | ICD-10-CM

## 2019-05-26 ENCOUNTER — Encounter: Payer: BLUE CROSS/BLUE SHIELD | Admitting: Obstetrics & Gynecology

## 2019-05-27 ENCOUNTER — Ambulatory Visit (HOSPITAL_COMMUNITY)
Admission: RE | Admit: 2019-05-27 | Discharge: 2019-05-27 | Disposition: A | Discharge status: Home / Self Care | Payer: BC Managed Care – PPO | Source: Ambulatory Visit | Attending: Obstetrics & Gynecology | Admitting: Obstetrics & Gynecology

## 2019-05-27 ENCOUNTER — Other Ambulatory Visit: Payer: Self-pay

## 2019-05-27 DIAGNOSIS — Z1231 Encounter for screening mammogram for malignant neoplasm of breast: Secondary | ICD-10-CM | POA: Insufficient documentation

## 2019-06-01 ENCOUNTER — Other Ambulatory Visit: Payer: Self-pay

## 2019-06-02 ENCOUNTER — Encounter: Payer: Self-pay | Admitting: Obstetrics & Gynecology

## 2019-06-02 DIAGNOSIS — Z0289 Encounter for other administrative examinations: Secondary | ICD-10-CM

## 2019-07-08 ENCOUNTER — Other Ambulatory Visit: Payer: Self-pay | Admitting: Internal Medicine

## 2019-07-08 ENCOUNTER — Other Ambulatory Visit (HOSPITAL_COMMUNITY): Payer: Self-pay | Admitting: Internal Medicine

## 2019-07-08 DIAGNOSIS — M542 Cervicalgia: Secondary | ICD-10-CM

## 2019-07-15 ENCOUNTER — Ambulatory Visit (HOSPITAL_COMMUNITY): Admission: RE | Admit: 2019-07-15 | Payer: BC Managed Care – PPO | Source: Ambulatory Visit

## 2019-07-21 ENCOUNTER — Encounter: Payer: BC Managed Care – PPO | Admitting: Obstetrics & Gynecology

## 2019-07-21 ENCOUNTER — Other Ambulatory Visit: Payer: Self-pay

## 2019-07-21 ENCOUNTER — Ambulatory Visit (HOSPITAL_COMMUNITY)
Admission: RE | Admit: 2019-07-21 | Discharge: 2019-07-21 | Disposition: A | Discharge status: Home / Self Care | Payer: BC Managed Care – PPO | Source: Ambulatory Visit | Attending: Internal Medicine | Admitting: Internal Medicine

## 2019-07-21 DIAGNOSIS — M542 Cervicalgia: Secondary | ICD-10-CM | POA: Diagnosis present

## 2019-08-11 HISTORY — PX: CERVICAL SPINE SURGERY: SHX589

## 2019-08-11 HISTORY — PX: ANTERIOR CERVICAL DECOMP/DISCECTOMY FUSION: SHX1161

## 2019-08-16 ENCOUNTER — Encounter: Payer: BC Managed Care – PPO | Admitting: Obstetrics & Gynecology

## 2019-08-29 ENCOUNTER — Encounter: Payer: BC Managed Care – PPO | Admitting: Obstetrics & Gynecology

## 2019-09-06 ENCOUNTER — Other Ambulatory Visit: Payer: Self-pay

## 2019-09-07 ENCOUNTER — Encounter: Payer: Self-pay | Admitting: Obstetrics & Gynecology

## 2019-09-07 ENCOUNTER — Ambulatory Visit (INDEPENDENT_AMBULATORY_CARE_PROVIDER_SITE_OTHER): Payer: BC Managed Care – PPO | Admitting: Obstetrics & Gynecology

## 2019-09-07 VITALS — BP 140/90 | Ht 65.5 in | Wt 137.0 lb

## 2019-09-07 DIAGNOSIS — Z90710 Acquired absence of both cervix and uterus: Secondary | ICD-10-CM

## 2019-09-07 DIAGNOSIS — Z01419 Encounter for gynecological examination (general) (routine) without abnormal findings: Secondary | ICD-10-CM

## 2019-09-07 DIAGNOSIS — Z780 Asymptomatic menopausal state: Secondary | ICD-10-CM | POA: Diagnosis not present

## 2019-09-07 NOTE — Progress Notes (Signed)
Gabrielle Rodriguez August 15, 1961 [id number]   History:    58 y.o. [id number] Married.  Her daughter in a Master OT program, and is my patient.  RP:  Established patient presenting for annual gyn exam   HPI: S/P Total Hysterectomy.  Menopause, well on no hormone replacement therapy.  Minimal hot flashes occasionally.  No pelvic pain.  No pain with intercourse.  Normal vaginal secretions.  Urine and bowel movements normal.  Breasts normal.  Body mass index 22.45.  Exercises regularly.  Health labs with family physician.  Past medical history,surgical history, family history and social history were all reviewed and documented in the EPIC chart.  Gynecologic History Patient's last menstrual period was 07/03/2011. Contraception: status post hysterectomy Last Pap: 05/2018. Results were: Negative Last mammogram: 05/2019. Results were: Negative Bone Density: 09/2016 Normal Colonoscopy: 2015, 10 yr schedule.  Cologard 2019.  Obstetric History OB History  Gravida Para Term Preterm AB Living  4 2     2 2   SAB TAB Ectopic Multiple Live Births  2            # Outcome Date GA Lbr Len/2nd Weight Sex Delivery Anes PTL Lv  4 SAB           3 SAB           2 Para           1 Para              ROS: A ROS was performed and pertinent positives and negatives are included in the history.  GENERAL: No fevers or chills. HEENT: No change in vision, no earache, sore throat or sinus congestion. NECK: No pain or stiffness. CARDIOVASCULAR: No chest pain or pressure. No palpitations. PULMONARY: No shortness of breath, cough or wheeze. GASTROINTESTINAL: No abdominal pain, nausea, vomiting or diarrhea, melena or bright red blood per rectum. GENITOURINARY: No urinary frequency, urgency, hesitancy or dysuria. MUSCULOSKELETAL: No joint or muscle pain, no back pain, no recent trauma. DERMATOLOGIC: No rash, no itching, no lesions. ENDOCRINE: No polyuria, polydipsia, no heat or cold intolerance. No recent change in weight.  HEMATOLOGICAL: No anemia or easy bruising or bleeding. NEUROLOGIC: No headache, seizures, numbness, tingling or weakness. PSYCHIATRIC: No depression, no loss of interest in normal activity or change in sleep pattern.     Exam:   BP 140/90    Ht 5' 5.5" (1.664 m)    Wt 137 lb (62.1 kg)    LMP 07/03/2011    BMI 22.45 kg/m   Body mass index is 22.45 kg/m.  General appearance : Well developed well nourished female. No acute distress HEENT: Eyes: no retinal hemorrhage or exudates,  Neck supple, trachea midline, no carotid bruits, no thyroidmegaly Lungs: Clear to auscultation, no rhonchi or wheezes, or rib retractions  Heart: Regular rate and rhythm, no murmurs or gallops Breast:Examined in sitting and supine position were symmetrical in appearance, no palpable masses or tenderness,  no skin retraction, no nipple inversion, no nipple discharge, no skin discoloration, no axillary or supraclavicular lymphadenopathy Abdomen: no palpable masses or tenderness, no rebound or guarding Extremities: no edema or skin discoloration or tenderness  Pelvic: Vulva: Normal             Vagina: No gross lesions or discharge.  No recurrence of Cystocele.  Cervix/Uterus absent  Adnexa  Without masses or tenderness  Anus: Normal   Assessment/Plan:  58 y.o. female for annual exam   1. Well female exam with routine  gynecological exam Gynecology exam s/p Total Hysterectomy.  Pap test 05/2018 negative, no indication to repeat this year.  Breast exam normal.  Screening mammo 05/2019 negative.  Health labs with Fam MD.  Alen Bleacher 2015.  Cologuard 2019.  2. Hx of total hysterectomy  3. Postmenopause Well on no HRT.  Bone Density 09/2016 normal.  Vit D supplement, Ca++ intake 1200 mg daily and regular weightbearing physical activities.  Other orders - methocarbamol (ROBAXIN) 500 MG tablet; Take 500 mg by mouth 4 (four) times daily.  Genia Del MD, 11:08 AM 09/07/2019

## 2019-09-12 ENCOUNTER — Encounter: Payer: Self-pay | Admitting: Obstetrics & Gynecology

## 2019-09-12 NOTE — Patient Instructions (Signed)
1. Well female exam with routine gynecological exam Gynecology exam s/p Total Hysterectomy.  Pap test 05/2018 negative, no indication to repeat this year.  Breast exam normal.  Screening mammo 05/2019 negative.  Health labs with Fam MD.  Gabrielle Rodriguez 2015.  Cologuard 2019.  2. Hx of total hysterectomy  3. Postmenopause Well on no HRT.  Bone Density 09/2016 normal.  Vit D supplement, Ca++ intake 1200 mg daily and regular weightbearing physical activities.  Other orders - methocarbamol (ROBAXIN) 500 MG tablet; Take 500 mg by mouth 4 (four) times daily.  Gabrielle Rodriguez, it was a pleasure seeing you today!

## 2020-08-21 ENCOUNTER — Other Ambulatory Visit (HOSPITAL_COMMUNITY): Payer: Self-pay | Admitting: Obstetrics & Gynecology

## 2020-08-21 DIAGNOSIS — Z1231 Encounter for screening mammogram for malignant neoplasm of breast: Secondary | ICD-10-CM

## 2020-08-24 ENCOUNTER — Ambulatory Visit (HOSPITAL_COMMUNITY): Payer: BC Managed Care – PPO

## 2020-08-31 ENCOUNTER — Ambulatory Visit (HOSPITAL_COMMUNITY): Payer: BC Managed Care – PPO

## 2020-09-07 ENCOUNTER — Encounter: Payer: BC Managed Care – PPO | Admitting: Obstetrics & Gynecology

## 2020-09-10 ENCOUNTER — Encounter: Payer: BC Managed Care – PPO | Admitting: Obstetrics & Gynecology

## 2020-09-12 ENCOUNTER — Ambulatory Visit (INDEPENDENT_AMBULATORY_CARE_PROVIDER_SITE_OTHER): Payer: BC Managed Care – PPO | Admitting: Obstetrics & Gynecology

## 2020-09-12 ENCOUNTER — Encounter: Payer: Self-pay | Admitting: Obstetrics & Gynecology

## 2020-09-12 ENCOUNTER — Other Ambulatory Visit: Payer: Self-pay

## 2020-09-12 VITALS — BP 110/70 | Ht 65.0 in | Wt 142.6 lb

## 2020-09-12 DIAGNOSIS — Z01419 Encounter for gynecological examination (general) (routine) without abnormal findings: Secondary | ICD-10-CM | POA: Diagnosis not present

## 2020-09-12 DIAGNOSIS — N816 Rectocele: Secondary | ICD-10-CM

## 2020-09-12 DIAGNOSIS — Z780 Asymptomatic menopausal state: Secondary | ICD-10-CM | POA: Diagnosis not present

## 2020-09-12 DIAGNOSIS — Z90710 Acquired absence of both cervix and uterus: Secondary | ICD-10-CM | POA: Diagnosis not present

## 2020-09-12 NOTE — Progress Notes (Signed)
ENGLISH TOMER [birth year] [id number]   History:    59 y.o. [id number] Married. Her daughter has a Teacher, early years/pre, and is my patient.  XA:[id number] presenting for annual gyn exam   HPI:S/P Total Hysterectomy.Menopause, well on no hormone replacement therapy. Minimal hot flashes occasionally. No pelvic pain. No pain with intercourse. Normal vaginal secretions. Urine and bowel movements normal.  Feeling a vaginal bulge after passing a BM or when lifting.  Sometimes needs to push it back with her fingers vaginally.  Breasts normal. Body mass index 23.73. Exercises regularly. Health labs with family physician.   Past medical history,surgical history, family history and social history were all reviewed and documented in the EPIC chart.  Gynecologic History Patient's last menstrual period was 07/03/2011.  Obstetric History OB History  Gravida Para Term Preterm AB Living  4 2     2 2   SAB TAB Ectopic Multiple Live Births  2            # Outcome Date GA Lbr Len/2nd Weight Sex Delivery Anes PTL Lv  4 SAB           3 SAB           2 Para           1 Para              ROS: A ROS was performed and pertinent positives and negatives are included in the history.  GENERAL: No fevers or chills. HEENT: No change in vision, no earache, sore throat or sinus congestion. NECK: No pain or stiffness. CARDIOVASCULAR: No chest pain or pressure. No palpitations. PULMONARY: No shortness of breath, cough or wheeze. GASTROINTESTINAL: No abdominal pain, nausea, vomiting or diarrhea, melena or bright red blood per rectum. GENITOURINARY: No urinary frequency, urgency, hesitancy or dysuria. MUSCULOSKELETAL: No joint or muscle pain, no back pain, no recent trauma. DERMATOLOGIC: No rash, no itching, no lesions. ENDOCRINE: No polyuria, polydipsia, no heat or cold intolerance. No recent change in weight. HEMATOLOGICAL: No anemia or easy bruising or bleeding. NEUROLOGIC: No headache, seizures,  numbness, tingling or weakness. PSYCHIATRIC: No depression, no loss of interest in normal activity or change in sleep pattern.     Exam:   BP 110/70   Ht 5\' 5"  (1.651 m)   Wt 142 lb 9.6 oz (64.7 kg)   LMP 07/03/2011   BMI 23.73 kg/m   Body mass index is 23.73 kg/m.  General appearance : Well developed well nourished female. No acute distress HEENT: Eyes: no retinal hemorrhage or exudates,  Neck supple, trachea midline, no carotid bruits, no thyroidmegaly Lungs: Clear to auscultation, no rhonchi or wheezes, or rib retractions  Heart: Regular rate and rhythm, no murmurs or gallops Breast:Examined in sitting and supine position were symmetrical in appearance, no palpable masses or tenderness,  no skin retraction, no nipple inversion, no nipple discharge, no skin discoloration, no axillary or supraclavicular lymphadenopathy Abdomen: no palpable masses or tenderness, no rebound or guarding Extremities: no edema or skin discoloration or tenderness  Pelvic: Vulva: Normal             Vagina: No gross lesions or discharge.  Mid vagina Rectocele grade 2/3 with Valsalva.  Cervix/Uterus absent  Adnexa  Without masses or tenderness  Anus: Normal   Assessment/Plan:  59 y.o. female for annual exam   1. Well female exam with routine gynecological exam Gynecologic exam status post total hysterectomy with a rectocele grade 2/3.  No indication for Pap test  this year.  Breast exam normal.  Will schedule a screening mammogram now.  Colonoscopy 2015.  Body mass index good at 23.73.  Continue with fitness and healthy nutrition.  Health labs with family physician.  2. Hx of total hysterectomy  3. Postmenopause Well on no hormone replacement therapy.  Bone density normal in November 2017.  Will repeat bone density at 5 years.  Vitamin D supplements, calcium intake of 1500 mg daily and regular weightbearing physical activities to continue.  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   Currently mildly symptomatic.  Precautions reviewed to avoid pelvic pressure.  Will do Kegel exercises to reinforce the pelvic floor.  Surgical management versus pessary reviewed.  Patient will call for reevaluation if becomes more symptomatic.  Would prefer surgical correction over pessary management if symptoms justify treatment.  Genia Del MD, 3:21 PM 09/12/2020

## 2020-09-13 ENCOUNTER — Encounter: Payer: Self-pay | Admitting: Obstetrics & Gynecology

## 2021-01-29 ENCOUNTER — Other Ambulatory Visit: Payer: Self-pay

## 2021-01-29 ENCOUNTER — Ambulatory Visit
Admission: EM | Admit: 2021-01-29 | Discharge: 2021-01-29 | Disposition: A | Discharge status: Home / Self Care | Payer: 59 | Attending: Physician Assistant | Admitting: Physician Assistant

## 2021-01-29 DIAGNOSIS — J069 Acute upper respiratory infection, unspecified: Secondary | ICD-10-CM | POA: Diagnosis not present

## 2021-01-29 MED ORDER — GUAIFENESIN-CODEINE 100-10 MG/5ML PO SOLN: 5.0000 mL | Freq: Three times a day (TID) | ORAL | 0 refills | Status: DC | PRN

## 2021-01-29 NOTE — ED Triage Notes (Signed)
Pt presents with sore throat and cough  That began Friday, pt has taken 3 covid test and were negative

## 2021-01-30 NOTE — ED Provider Notes (Signed)
[facility]    CSN: [id number] Arrival date & time: 01/29/21  1814      History   Chief Complaint Chief Complaint  Patient presents with  . Sore Throat    HPI Gabrielle Rodriguez is a 60 y.o. female.   The history is provided by the patient. No language interpreter was used.  Sore Throat This is a new problem. The problem occurs constantly. The problem has been gradually worsening. Nothing aggravates the symptoms. Nothing relieves the symptoms. She has tried nothing for the symptoms. The treatment provided no relief.  Pt complains of a cough and sore throat.  Pt reports cough is keeping her up   Past Medical History:  Diagnosis Date  . SVD (spontaneous vaginal delivery)    x 1    Patient Active Problem List   Diagnosis Date Noted  . Medial meniscus, posterior horn derangement   . Special screening for malignant neoplasms, colon   . ACL laxity 07/24/2011  . Knee pain 07/24/2011    Past Surgical History:  Procedure Laterality Date  . BUNIONECTOMY Right   . CERVICAL SPINE SURGERY  08/2019  . CESAREAN SECTION     x 1  . COLONOSCOPY N/A 11/20/2014   Procedure: COLONOSCOPY;  Surgeon: West Bali, MD;  Location: [facility];  Service: Endoscopy;  Laterality: N/A;  1:30 PM - moved to 1/11 @ 11:30 - Doris to notify pt  . CYSTOCELE REPAIR N/A 07/29/2013   Procedure: ANTERIOR REPAIR (CYSTOCELE);  Surgeon: Genia Del, MD;  Location: [facility];  Service: Gynecology;  Laterality: N/A;  . EYE SURGERY     eye surgery at age 18 yrs  . KNEE ARTHROSCOPY WITH MEDIAL MENISECTOMY Left 09/21/2015   Procedure: KNEE ARTHROSCOPY WITH PARTIAL MEDIAL MENISECTOMY;  Surgeon: Vickki Hearing, MD;  Location: [facility];  Service: Orthopedics;  Laterality: Left;  Marland Kitchen VAGINAL HYSTERECTOMY N/A 07/29/2013   Procedure: HYSTERECTOMY VAGINAL with McCalls;  Surgeon: Genia Del, MD;  Location: [facility];  Service: Gynecology;  Laterality: N/A;  . WISDOM TOOTH EXTRACTION      OB History     Gravida  4   Para  2   Term      Preterm      AB  2   Living  2     SAB  2   IAB      Ectopic      Multiple      Live Births               Home Medications    Prior to Admission medications   Medication Sig Start Date End Date Taking? Authorizing Provider  guaiFENesin-codeine 100-10 MG/5ML syrup Take 5 mLs by mouth 3 (three) times daily as needed for cough. 01/29/21  Yes Cheron Schaumann K, PA-C  ATORVASTATIN CALCIUM PO Take by mouth.    [provider]    Family History Family History  Problem Relation Age of Onset  . Cancer Mother        lung  . Breast cancer Maternal Grandmother   . Diabetes Maternal Grandfather   . Diabetes Paternal Grandmother   . Hypertension Paternal Grandfather     Social History Social History   Tobacco Use  . Smoking status: Never Smoker  . Smokeless tobacco: Never Used  Vaping Use  . Vaping Use: Never used  Substance Use Topics  . Alcohol use: Yes    Comment: socially  . Drug use: No  Allergies   Patient has no known allergies.   Review of Systems Review of Systems  All other systems reviewed and are negative.    Physical Exam Triage Vital Signs ED Triage Vitals [01/29/21 1821]  Enc Vitals Group     BP 131/73     Pulse Rate 63     Resp 18     Temp (!) 97.5 F (36.4 C)     Temp Source Oral     SpO2 97 %     Weight      Height      Head Circumference      Peak Flow      Pain Score      Pain Loc      Pain Edu?      Excl. in GC?    No data found.  Updated Vital Signs BP 131/73   Pulse 63   Temp (!) 97.5 F (36.4 C) (Oral)   Resp 18   LMP 07/03/2011   SpO2 97%   Visual Acuity Right Eye Distance:   Left Eye Distance:   Bilateral Distance:    Right Eye Near:   Left Eye Near:    Bilateral Near:     Physical Exam Vitals and nursing note reviewed.  Constitutional:      Appearance: She is well-developed.  HENT:     Head: Normocephalic.  Cardiovascular:     Heart sounds:  Normal heart sounds.  Pulmonary:     Effort: Pulmonary effort is normal.  Abdominal:     General: There is no distension.  Musculoskeletal:        General: Normal range of motion.     Cervical back: Normal range of motion.  Skin:    General: Skin is warm.  Neurological:     Mental Status: She is alert and oriented to person, place, and time.      UC Treatments / Results  Labs (all labs ordered are listed, but only abnormal results are displayed) Labs Reviewed - No data to display  EKG   Radiology No results found.  Procedures Procedures (including critical care time)  Medications Ordered in UC Medications - No data to display  Initial Impression / Assessment and Plan / UC Course  I have reviewed the triage vital signs and the nursing notes.  Pertinent labs & imaging results that were available during my care of the patient were reviewed by me and considered in my medical decision making (see chart for details).     MDM:  Pt reassured symptoms are viral  Pt request cough medication with codeine.  I will given 5 day supply cough medication Final Clinical Impressions(s) / UC Diagnoses   Final diagnoses:  Viral URI   Discharge Instructions   None    ED Prescriptions    Medication Sig Dispense Auth. Provider   guaiFENesin-codeine 100-10 MG/5ML syrup Take 5 mLs by mouth 3 (three) times daily as needed for cough. 75 mL Elson Areas, [place]     An After Visit Summary was printed and given to the patient.  PDMP not reviewed this encounter.   Elson Areas, [place] 01/30/21 1322

## 2021-08-07 ENCOUNTER — Ambulatory Visit (HOSPITAL_COMMUNITY)
Admission: RE | Admit: 2021-08-07 | Discharge: 2021-08-07 | Disposition: A | Discharge status: Home / Self Care | Payer: 59 | Source: Ambulatory Visit | Attending: Obstetrics & Gynecology | Admitting: Obstetrics & Gynecology

## 2021-08-07 ENCOUNTER — Other Ambulatory Visit: Payer: Self-pay

## 2021-08-07 DIAGNOSIS — Z1231 Encounter for screening mammogram for malignant neoplasm of breast: Secondary | ICD-10-CM | POA: Insufficient documentation

## 2021-09-13 ENCOUNTER — Ambulatory Visit: Payer: Self-pay | Admitting: Obstetrics & Gynecology

## 2021-11-25 ENCOUNTER — Encounter: Payer: Self-pay | Admitting: Obstetrics & Gynecology

## 2021-11-25 ENCOUNTER — Ambulatory Visit (INDEPENDENT_AMBULATORY_CARE_PROVIDER_SITE_OTHER): Payer: 59 | Admitting: Obstetrics & Gynecology

## 2021-11-25 ENCOUNTER — Other Ambulatory Visit (HOSPITAL_COMMUNITY)
Admission: RE | Admit: 2021-11-25 | Discharge: 2021-11-25 | Disposition: A | Discharge status: Home / Self Care | Payer: 59 | Source: Ambulatory Visit | Attending: Obstetrics & Gynecology | Admitting: Obstetrics & Gynecology

## 2021-11-25 ENCOUNTER — Other Ambulatory Visit: Payer: Self-pay

## 2021-11-25 VITALS — BP 130/88 | HR 94 | Resp 18 | Ht 65.75 in | Wt 143.2 lb

## 2021-11-25 DIAGNOSIS — Z780 Asymptomatic menopausal state: Secondary | ICD-10-CM | POA: Diagnosis not present

## 2021-11-25 DIAGNOSIS — Z90710 Acquired absence of both cervix and uterus: Secondary | ICD-10-CM | POA: Diagnosis not present

## 2021-11-25 DIAGNOSIS — N816 Rectocele: Secondary | ICD-10-CM

## 2021-11-25 DIAGNOSIS — Z01419 Encounter for gynecological examination (general) (routine) without abnormal findings: Secondary | ICD-10-CM | POA: Insufficient documentation

## 2021-11-25 NOTE — Progress Notes (Signed)
CAROLYN SYLVIA Mar 08, 1961 [id number]   History:    61 y.o.  [id number] Married.  42 yo daughter [redacted] wks pregnant.  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    RP:  Established patient presenting for annual gyn exam    HPI: S/P Total Hysterectomy.  Postmenopause, well on no hormone replacement therapy.  Minimal hot flashes occasionally.  No pelvic pain.  No pain with intercourse.  Pap Neg in 2019.  Normal vaginal secretions. Urine and bowel movements normal.  Small Rectocele:  Feeling a vaginal bulge after passing a BM or when lifting, sometimes needs to push it back with her fingers vaginally.  Breasts normal.  Mammo 07/2021 Neg.  Body mass index 23.29. Exercises regularly.  Health labs with family physician.  BMD: 09/15/2016, normal. Cologuard 2019 neg, will repeat this year.  Past medical history,surgical history, family history and social history were all reviewed and documented in the EPIC chart.  Gynecologic History Patient's last menstrual period was 07/03/2011.  Obstetric History OB History  Gravida Para Term Preterm AB Living  4 2     2 2   SAB IAB Ectopic Multiple Live Births  2            # Outcome Date GA Lbr Len/2nd Weight Sex Delivery Anes PTL Lv  4 SAB           3 SAB           2 Para           1 Para              ROS: A ROS was performed and pertinent positives and negatives are included in the history.  GENERAL: No fevers or chills. HEENT: No change in vision, no earache, sore throat or sinus congestion. NECK: No pain or stiffness. CARDIOVASCULAR: No chest pain or pressure. No palpitations. PULMONARY: No shortness of breath, cough or wheeze. GASTROINTESTINAL: No abdominal pain, nausea, vomiting or diarrhea, melena or bright red blood per rectum. GENITOURINARY: No urinary frequency, urgency, hesitancy or dysuria. MUSCULOSKELETAL: No joint or muscle pain, no back pain, no recent trauma. DERMATOLOGIC: No rash, no itching, no lesions. ENDOCRINE: No polyuria, polydipsia,  no heat or cold intolerance. No recent change in weight. HEMATOLOGICAL: No anemia or easy bruising or bleeding. NEUROLOGIC: No headache, seizures, numbness, tingling or weakness. PSYCHIATRIC: No depression, no loss of interest in normal activity or change in sleep pattern.     Exam:   BP 130/88 (BP Location: Right Arm, Patient Position: Sitting)    Pulse 94    Resp 18    Ht 5' 5.75" (1.67 m)    Wt 143 lb 3.2 oz (65 kg)    LMP 07/03/2011    SpO2 98%    BMI 23.29 kg/m   Body mass index is 23.29 kg/m.  General appearance : Well developed well nourished female. No acute distress HEENT: Eyes: no retinal hemorrhage or exudates,  Neck supple, trachea midline, no carotid bruits, no thyroidmegaly Lungs: Clear to auscultation, no rhonchi or wheezes, or rib retractions  Heart: Regular rate and rhythm, no murmurs or gallops Breast:Examined in sitting and supine position were symmetrical in appearance, no palpable masses or tenderness,  no skin retraction, no nipple inversion, no nipple discharge, no skin discoloration, no axillary or supraclavicular lymphadenopathy Abdomen: no palpable masses or tenderness, no rebound or guarding Extremities: no edema or skin discoloration or tenderness  Pelvic: Vulva: Normal  Vagina: No gross lesions or discharge.  Rectocele grade 2/3.  Pap reflex done.  Cervix/Uterus absent  Adnexa  Without masses or tenderness  Anus: Normal   Assessment/Plan:  61 y.o. female for annual exam   1. Well female exam with routine gynecological exam S/P Total Hysterectomy.  Postmenopause, well on no hormone replacement therapy.  Minimal hot flashes occasionally.  No pelvic pain.  No pain with intercourse.  Pap Neg in 2019.  Normal vaginal secretions. Urine and bowel movements normal.  Small Rectocele:  Feeling a vaginal bulge after passing a BM or when lifting, sometimes needs to push it back with her fingers vaginally.  Breasts normal.  Mammo 07/2021 Neg.  Body mass index  23.29. Exercises regularly.  Health labs with family physician.  BMD: 09/15/2016, normal. Cologuard 2019 neg, will repeat this year. - Cytology - PAP( [place])  2. Hx of total hysterectomy  3. Postmenopause S/P Total Hysterectomy.  Postmenopause, well on no hormone replacement therapy.  Minimal hot flashes occasionally.  No pelvic pain.  No pain with intercourse.   4. Baden-Walker grade 2 rectocele  Small Rectocele grade 2/3, stable.  Feeling a vaginal bulge after passing a BM or when lifting, sometimes needs to push it back with her fingers vaginally.  Doing Kegels and avoiding pelvic floor pressure.  Continue to observe.  Other orders - metFORMIN (GLUCOPHAGE-XR) 500 MG 24 hr tablet; Take 500 mg by mouth daily.   Genia Del MD, 2:19 PM 11/25/2021

## 2021-11-28 LAB — CYTOLOGY - PAP
Comment: NEGATIVE
Diagnosis: UNDETERMINED — AB
High risk HPV: NEGATIVE

## 2021-11-28 IMAGING — MG MM DIGITAL SCREENING BILAT W/ TOMO AND CAD
8 series · 9 of 24 positions shown · non-contrast
Comparison: Previous exam(s).

CLINICAL DATA: Screening.

EXAM:
DIGITAL SCREENING BILATERAL MAMMOGRAM WITH TOMOSYNTHESIS AND CAD
TECHNIQUE: Bilateral screening digital craniocaudal and mediolateral oblique
mammograms were obtained. Bilateral screening digital breast
tomosynthesis was performed. The images were evaluated with
computer-aided detection.

[R CC synth-2D]
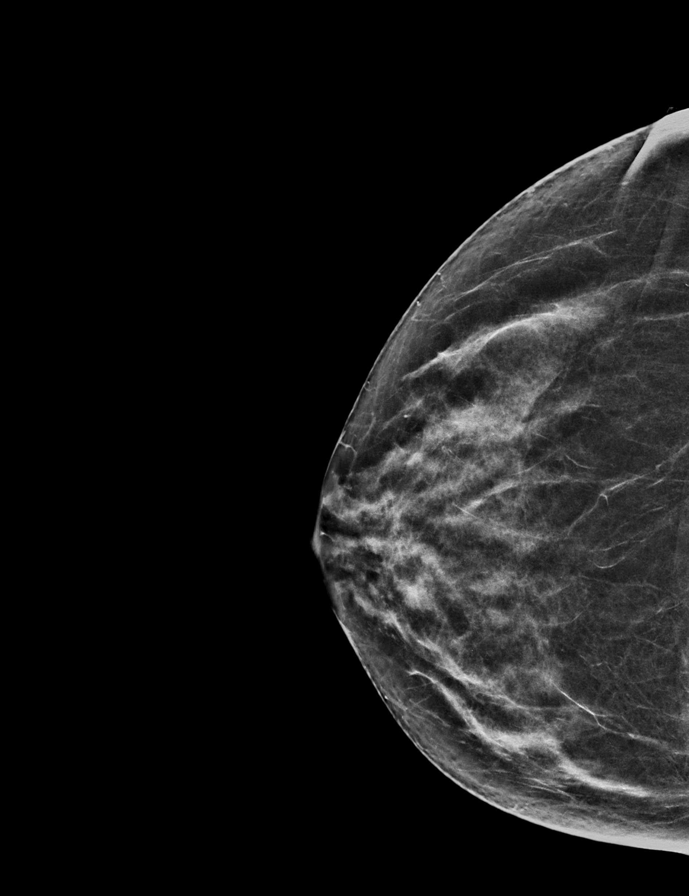

[L MLO synth-2D]
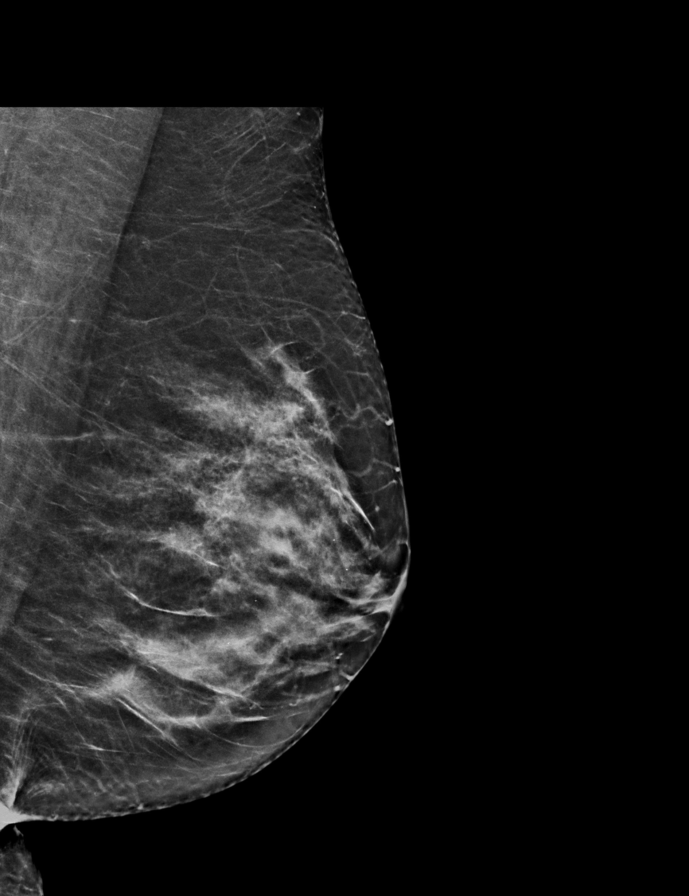

[L CC synth-2D]
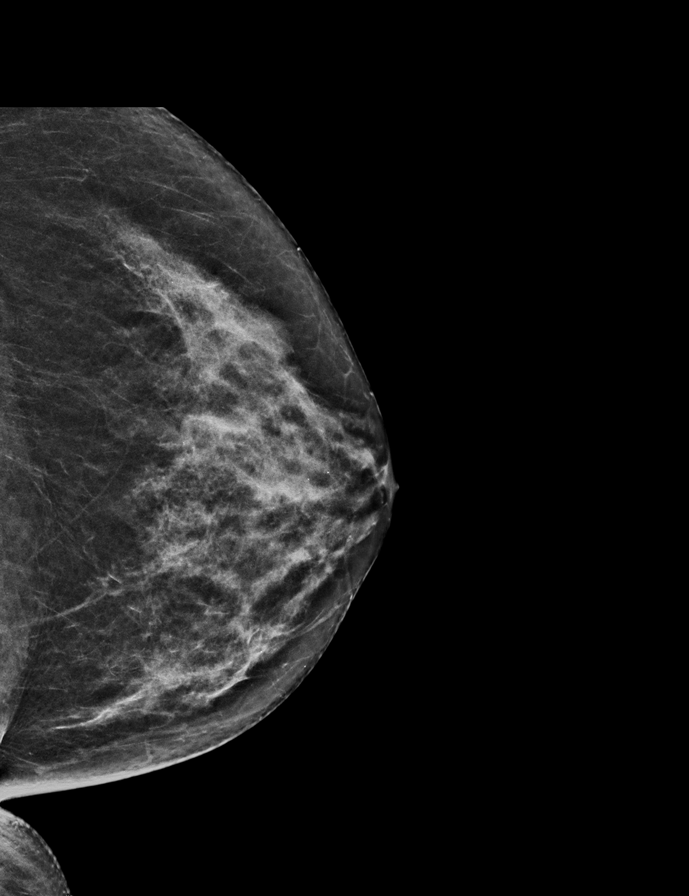

[R MLO synth-2D]
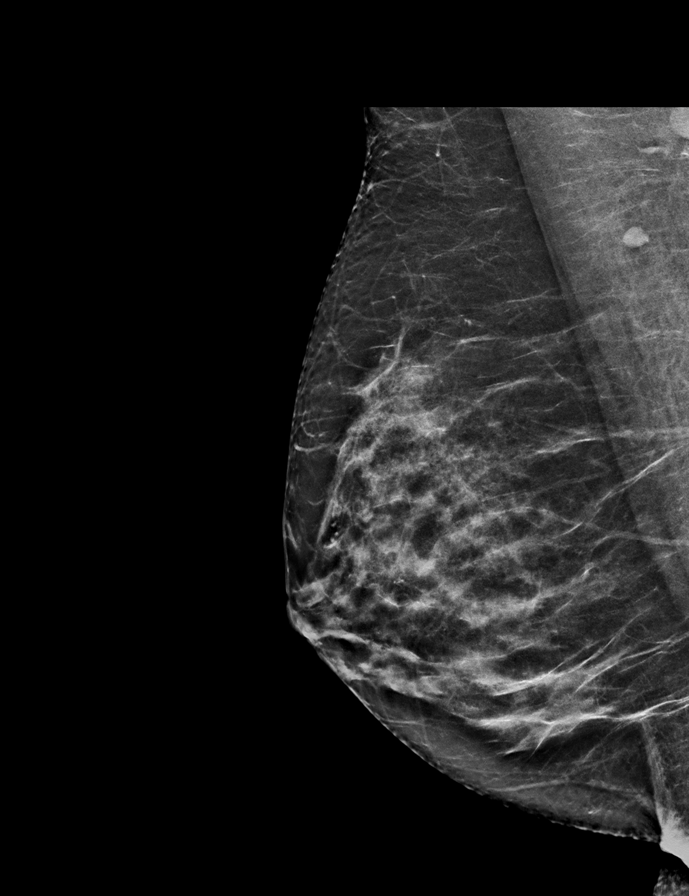

[L CC tomo · 2 of 56 frames shown]
[frame 19/56]
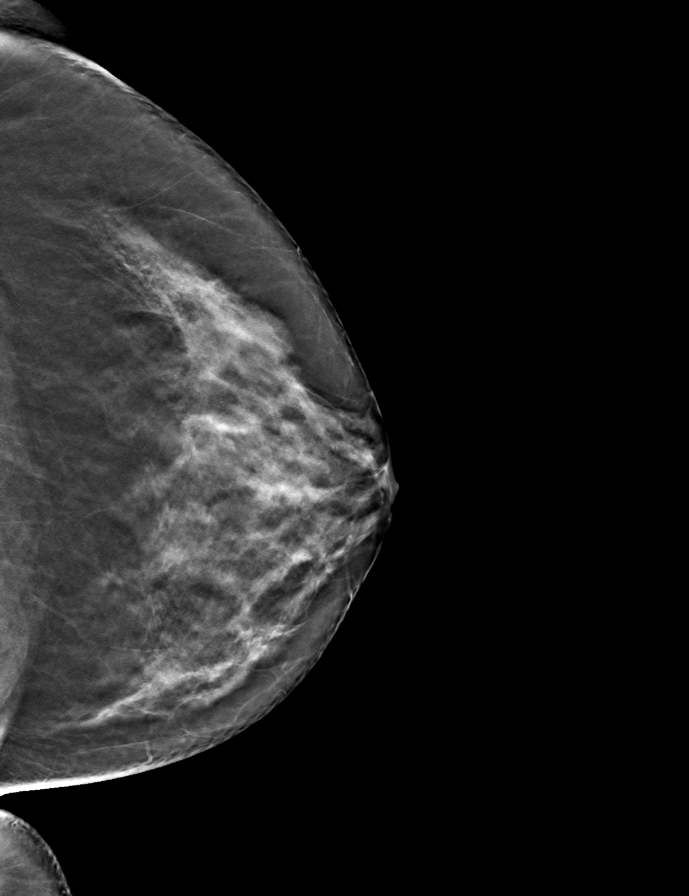
[frame 29/56]
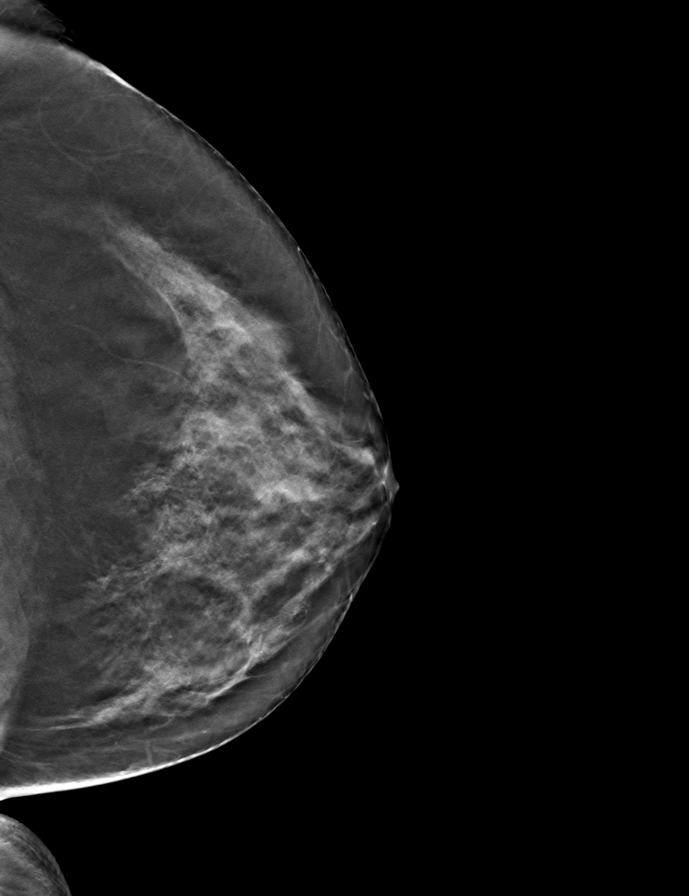

[R CC tomo · tomo slice 29/57.0]
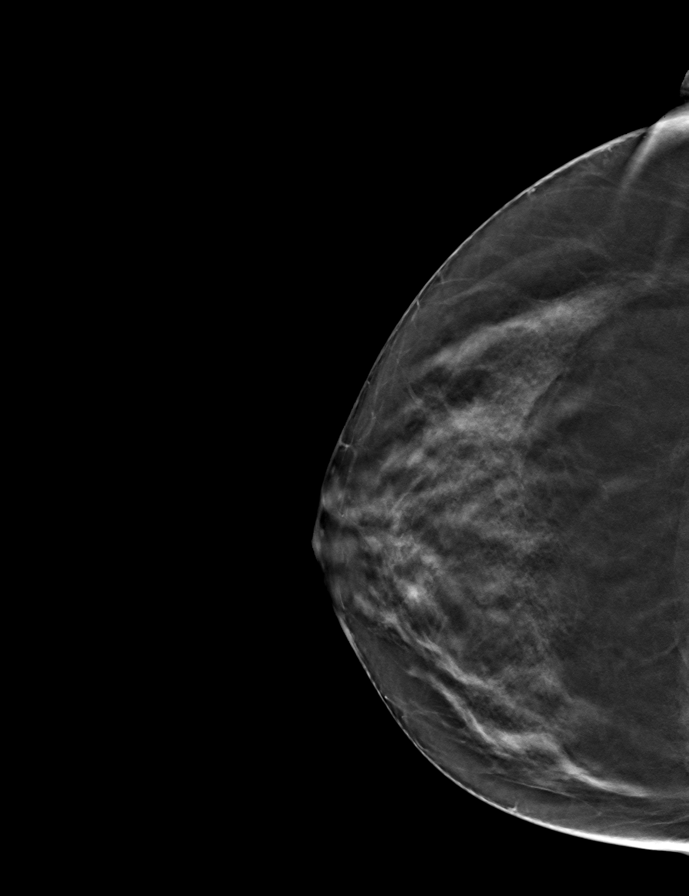

[L MLO tomo · tomo slice 29/58.0]
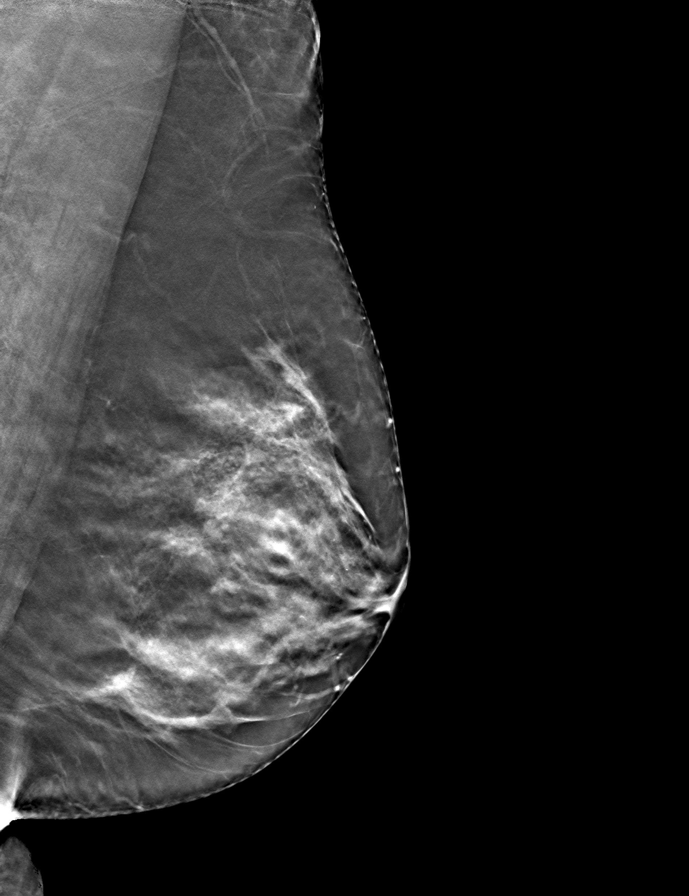

[R MLO tomo · tomo slice 31/60.0]
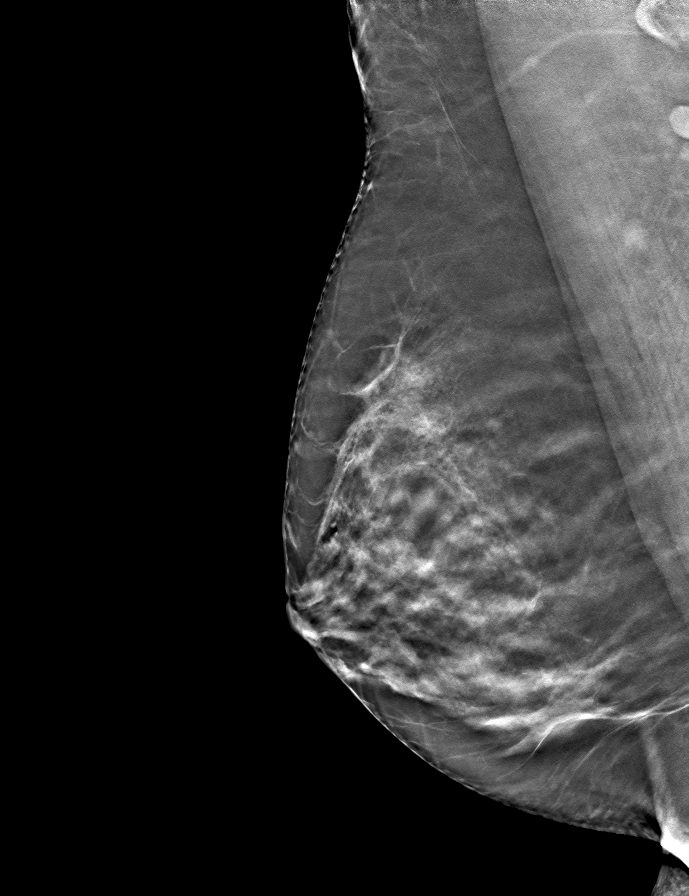

[9 of 24 positions shown; findings below may reference images not displayed]

ACR Breast Density Category c: The breast tissue is heterogeneously
dense, which may obscure small masses.
FINDINGS: There are no findings suspicious for malignancy.
IMPRESSION: No mammographic evidence of malignancy. A result letter of this
screening mammogram will be mailed directly to the patient.

RECOMMENDATION:
Screening mammogram in one year. (Code:Q3-W-BC3)

BI-RADS CATEGORY  1: Negative.

## 2021-12-18 ENCOUNTER — Ambulatory Visit: Payer: 59 | Admitting: Obstetrics & Gynecology

## 2022-07-01 ENCOUNTER — Other Ambulatory Visit (HOSPITAL_COMMUNITY): Payer: Self-pay | Admitting: Obstetrics & Gynecology

## 2022-07-01 DIAGNOSIS — Z1231 Encounter for screening mammogram for malignant neoplasm of breast: Secondary | ICD-10-CM

## 2022-08-06 NOTE — Progress Notes (Signed)
No chief complaint on file.

## 2022-08-13 ENCOUNTER — Ambulatory Visit (HOSPITAL_COMMUNITY)
Admission: RE | Admit: 2022-08-13 | Discharge: 2022-08-13 | Disposition: A | Discharge status: Home / Self Care | Payer: 59 | Source: Ambulatory Visit | Attending: Obstetrics & Gynecology | Admitting: Obstetrics & Gynecology

## 2022-08-13 DIAGNOSIS — Z1231 Encounter for screening mammogram for malignant neoplasm of breast: Secondary | ICD-10-CM | POA: Diagnosis present

## 2022-09-11 LAB — COLOGUARD: COLOGUARD: NEGATIVE

## 2022-11-26 ENCOUNTER — Other Ambulatory Visit (HOSPITAL_COMMUNITY)
Admission: RE | Admit: 2022-11-26 | Discharge: 2022-11-26 | Disposition: A | Discharge status: Home / Self Care | Payer: 59 | Source: Ambulatory Visit | Attending: Obstetrics & Gynecology | Admitting: Obstetrics & Gynecology

## 2022-11-26 ENCOUNTER — Encounter: Payer: Self-pay | Admitting: Obstetrics & Gynecology

## 2022-11-26 ENCOUNTER — Ambulatory Visit (INDEPENDENT_AMBULATORY_CARE_PROVIDER_SITE_OTHER): Payer: 59 | Admitting: Obstetrics & Gynecology

## 2022-11-26 VITALS — BP 122/78 | HR 83 | Ht 65.25 in | Wt 143.0 lb

## 2022-11-26 DIAGNOSIS — Z01419 Encounter for gynecological examination (general) (routine) without abnormal findings: Secondary | ICD-10-CM | POA: Diagnosis not present

## 2022-11-26 DIAGNOSIS — N815 Vaginal enterocele: Secondary | ICD-10-CM

## 2022-11-26 DIAGNOSIS — R87610 Atypical squamous cells of undetermined significance on cytologic smear of cervix (ASC-US): Secondary | ICD-10-CM | POA: Diagnosis not present

## 2022-11-26 DIAGNOSIS — Z780 Asymptomatic menopausal state: Secondary | ICD-10-CM | POA: Diagnosis not present

## 2022-11-26 DIAGNOSIS — Z90710 Acquired absence of both cervix and uterus: Secondary | ICD-10-CM

## 2022-11-26 DIAGNOSIS — Z1272 Encounter for screening for malignant neoplasm of vagina: Secondary | ICD-10-CM | POA: Diagnosis present

## 2022-11-26 NOTE — Progress Notes (Signed)
Gabrielle Rodriguez 05/03/1961 [id number]   History:    62 y.o.  G4P2A2L2 Married.  7 yo daughter [redacted] wks pregnant.  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    RP:  Established patient presenting for annual gyn exam    HPI: S/P Total Hysterectomy.  Postmenopause, well on no hormone replacement therapy. No pelvic pain.  No pain with intercourse.  Pap ASCUS/HPV HR Neg 11/2021.  Pap reflex today.  Normal vaginal secretions. C/O more of a vaginal bulge which is uncomfortable.  Needs to push with her fingers vaginally to completely empty her bladder.  Did pelvic floor strengthening exercises, but not helping anymore. Would like surgical correction.  No SUI.  Bowel movements normal.  Breasts normal.  Mammo 08/2022 Neg.  Body mass index 23.61. Exercises regularly, golfer.  BMD: 09/15/2016, normal. Cologuard Neg 08/2022.  Health labs with family physician.    Past medical history,surgical history, family history and social history were all reviewed and documented in the EPIC chart.  Gynecologic History Patient's last menstrual period was 07/03/2011.  Obstetric History OB History  Gravida Para Term Preterm AB Living  4 2     2 2   SAB IAB Ectopic Multiple Live Births  2            # Outcome Date GA Lbr Len/2nd Weight Sex Delivery Anes PTL Lv  4 SAB           3 SAB           2 Para           1 Para             ROS: A ROS was performed and pertinent positives and negatives are included in the history. GENERAL: No fevers or chills. HEENT: No change in vision, no earache, sore throat or sinus congestion. NECK: No pain or stiffness. CARDIOVASCULAR: No chest pain or pressure. No palpitations. PULMONARY: No shortness of breath, cough or wheeze. GASTROINTESTINAL: No abdominal pain, nausea, vomiting or diarrhea, melena or bright red blood per rectum. GENITOURINARY: No urinary frequency, urgency, hesitancy or dysuria. MUSCULOSKELETAL: No joint or muscle pain, no back pain, no recent trauma. DERMATOLOGIC:  No rash, no itching, no lesions. ENDOCRINE: No polyuria, polydipsia, no heat or cold intolerance. No recent change in weight. HEMATOLOGICAL: No anemia or easy bruising or bleeding. NEUROLOGIC: No headache, seizures, numbness, tingling or weakness. PSYCHIATRIC: No depression, no loss of interest in normal activity or change in sleep pattern.     Exam:  BP 122/78   Pulse 83   Ht 5' 5.25" (1.657 m)   Wt 143 lb (64.9 kg)   LMP 07/03/2011 Comment: SA  SpO2 98%   BMI 23.61 kg/m   Body mass index is 23.61 kg/m.  General appearance : Well developed well nourished female. No acute distress HEENT: Eyes: no retinal hemorrhage or exudates,  Neck supple, trachea midline, no carotid bruits, no thyroidmegaly Lungs: Clear to auscultation, no rhonchi or wheezes, or rib retractions  Heart: Regular rate and rhythm, no murmurs or gallops Breast:Examined in sitting and supine position were symmetrical in appearance, no palpable masses or tenderness,  no skin retraction, no nipple inversion, no nipple discharge, no skin discoloration, no axillary or supraclavicular lymphadenopathy Abdomen: no palpable masses or tenderness, no rebound or guarding Extremities: no edema or skin discoloration or tenderness  Pelvic: Vulva: Normal             Vagina: No gross lesions or discharge.  Pap reflex done.  Cervix/Uterus absent  Adnexa  Without masses or tenderness  Anus: Normal  Vaginal exam in standing position with Valsalva:  Complete colpocele with vaginal apex protruding about 2 cm below the vulva.     Assessment/Plan:  62 y.o. female for annual exam   1. Encounter for Papanicolaou smear of vagina as part of routine gynecological examination S/P Total Hysterectomy.  Postmenopause, well on no hormone replacement therapy. No pelvic pain.  No pain with intercourse.  Pap ASCUS/HPV HR Neg 11/2021.  Pap reflex today.  Normal vaginal secretions. C/O more of a vaginal bulge which is uncomfortable.  Needs to push with  her fingers vaginally to completely empty her bladder.  Did pelvic floor strengthening exercises, but not helping anymore. Would like surgical correction.  No SUI.  Bowel movements normal.  Breasts normal.  Mammo 08/2022 Neg.  Body mass index 23.61. Exercises regularly, golfer.  BMD: 09/15/2016, normal. Cologuard Neg 08/2022.  Health labs with family physician.  - Cytology - PAP( [place])  2. ASCUS of cervix with negative high risk HPV - Cytology - PAP( [place])  3. Hx of total hysterectomy  4. Postmenopause S/P Total Hysterectomy.  Postmenopause, well on no hormone replacement therapy. No pelvic pain.  No pain with intercourse. BMD: 09/15/2016, normal.  5. Colpocele C/O more of a vaginal bulge which is uncomfortable.  Needs to push with her fingers vaginally to completely empty her bladder.  Did pelvic floor strengthening exercises, but not helping anymore. Would like surgical correction.  No SUI.  Bowel movements normal. Vaginal exam in standing position with Valsalva:  Complete colpocele with vaginal apex protruding about 2 cm below the vulva.  SacroSpinous suspension and Robotic ColpoSacropexy discussed.  Possible need for Sling procedure also discussed.  Refer to Dr Sherlene Shams for evaluation and management.  Other orders - atorvastatin (LIPITOR) 40 MG tablet; Take 1 tablet by mouth daily. - ALPRAZolam (XANAX) 0.25 MG tablet; Take 0.25 mg by mouth at bedtime as needed. - pantoprazole (PROTONIX) 40 MG tablet; Take by mouth.   Princess Bruins MD, 2:12 PM

## 2022-11-28 ENCOUNTER — Other Ambulatory Visit: Payer: Self-pay | Admitting: Obstetrics & Gynecology

## 2022-11-28 DIAGNOSIS — N816 Rectocele: Secondary | ICD-10-CM

## 2022-11-28 LAB — CYTOLOGY - PAP
Diagnosis: NEGATIVE
Diagnosis: REACTIVE

## 2022-12-29 ENCOUNTER — Encounter: Payer: Self-pay | Admitting: *Deleted

## 2023-02-04 ENCOUNTER — Encounter: Payer: Self-pay | Admitting: Obstetrics and Gynecology

## 2023-02-04 ENCOUNTER — Ambulatory Visit: Payer: 59 | Admitting: Obstetrics and Gynecology

## 2023-02-04 VITALS — BP 159/84 | HR 70 | Ht 66.1 in | Wt 146.0 lb

## 2023-02-04 DIAGNOSIS — N816 Rectocele: Secondary | ICD-10-CM

## 2023-02-04 DIAGNOSIS — N993 Prolapse of vaginal vault after hysterectomy: Secondary | ICD-10-CM

## 2023-02-04 DIAGNOSIS — N8110 Cystocele, unspecified: Secondary | ICD-10-CM

## 2023-02-04 DIAGNOSIS — R7303 Prediabetes: Secondary | ICD-10-CM | POA: Insufficient documentation

## 2023-02-04 NOTE — Progress Notes (Signed)
[facility] New Patient Evaluation and Consultation  Referring Provider: Princess Bruins, MD PCP: Pablo Lawrence, NP Date of Service: 02/04/2023  SUBJECTIVE Chief Complaint: New Patient (Initial Visit) Gabrielle Rodriguez is a 62 y.o. female here for a consult a rectocele./)  History of Present Illness: Gabrielle Rodriguez is a 62 y.o. White or Caucasian female seen in consultation at the request of Dr. Dellis Filbert for evaluation of prolapse.    Has done home pelvic floor exercises but never done formal pelvic PT. Denies ever being on medication for the bladder. Reports some vaginal dryness related to the bulge but does not use estrogen cream or lubrication of any kind.   Review of records from Dr Dellis Filbert significant for: S/p hysterectomy. Has more of a vaginal bulge now and has to place fingers vaginally to empty bladder. Noted prolapse of vaginal apex on exam.   Cystocele repair at time of hysterectomy in 2014  Urinary Symptoms: Does not leak urine.    Day time voids 8-10.  Nocturia: 1 times per night to void. Voiding dysfunction: she does not empty her bladder well.  does not use a catheter to empty bladder.  When urinating, she feels dribbling after finishing, the need to urinate multiple times in a row, and to push on her belly or vagina to empty bladder Drinks: Coffee, 60-80oz water per day, drinking up until bedtime and keeps bottle at bedside.  UTIs: 0 UTI's in the last year.   Denies history of blood in urine and kidney or bladder stones  Pelvic Organ Prolapse Symptoms:                  She Admits to a feeling of a bulge the vaginal area. It has been present for 3 years.  She Admits to seeing a bulge.  This bulge is bothersome. Underwent vaginal hysterectomy, anterior repair and McCall's Culdoplasty in 2014.   Bowel Symptom: Bowel movements: 1-2 time(s) per day Stool consistency: hard or soft -Type 4 on [facility] stool chart Straining: no.  Splinting: no.   Incomplete evacuation: no.  She Denies accidental bowel leakage / fecal incontinence Bowel regimen: none Last colonoscopy: Date 2013, Results Normal  Sexual Function Sexually active: yes.  Sexual orientation: Straight Pain with sex: has discomfort due to prolapse  Pelvic Pain Denies pelvic pain    Past Medical History:  Past Medical History:  Diagnosis Date   Elevated cholesterol    Prediabetes    SVD (spontaneous vaginal delivery)    x 1     Past Surgical History:   Past Surgical History:  Procedure Laterality Date   BUNIONECTOMY Right    CERVICAL SPINE SURGERY  08/2019   CESAREAN SECTION     x 1   COLONOSCOPY N/A 11/20/2014   Procedure: COLONOSCOPY;  Surgeon: Danie Binder, MD;  Location: [facility];  Service: Endoscopy;  Laterality: N/A;  1:30 PM - moved to 1/11 @ 11:30 - Doris to notify pt   CYSTOCELE REPAIR N/A 07/29/2013   Procedure: ANTERIOR REPAIR (CYSTOCELE);  Surgeon: Princess Bruins, MD;  Location: [facility];  Service: Gynecology;  Laterality: N/A;   EYE SURGERY     eye surgery at age 40 yrs   KNEE ARTHROSCOPY WITH MEDIAL MENISECTOMY Left 09/21/2015   Procedure: KNEE ARTHROSCOPY WITH PARTIAL MEDIAL MENISECTOMY;  Surgeon: Carole Civil, MD;  Location: [facility];  Service: Orthopedics;  Laterality: Left;   VAGINAL HYSTERECTOMY N/A 07/29/2013   Procedure: HYSTERECTOMY VAGINAL with McCalls;  Surgeon:  Princess Bruins, MD;  Location: [facility];  Service: Gynecology;  Laterality: N/A;   WISDOM TOOTH EXTRACTION       Past OB/GYN History: OB History  Gravida Para Term Preterm AB Living  4 2     2 2   SAB IAB Ectopic Multiple Live Births  2            # Outcome Date GA Lbr Len/2nd Weight Sex Delivery Anes PTL Lv  4 SAB           3 SAB           2 Para           1 Para             Vaginal deliveries: 1 C-Section: 1 S/p hysterectomy   Medications: She has a current medication list which includes the following prescription(s): atorvastatin, metformin,  and pantoprazole.   Allergies: Patient has No Known Allergies.   Social History:  Social History   Tobacco Use   Smoking status: Never   Smokeless tobacco: Never  Vaping Use   Vaping Use: Never used  Substance Use Topics   Alcohol use: Yes    Comment: socially   Drug use: No    Relationship status: married She lives with husband.   She is employed works in Nurse, learning disability for [employer]. Regular exercise: Yes: Golfing History of abuse: No  Family History:   Family History  Problem Relation Age of Onset   Cancer Mother        lung   Breast cancer Maternal Grandmother    Diabetes Maternal Grandfather    Diabetes Paternal Grandmother    Hypertension Paternal Grandfather      Review of Systems: Review of Systems  Constitutional:  Negative for fever, malaise/fatigue and weight loss.  Respiratory:  Negative for cough, shortness of breath and wheezing.   Cardiovascular:  Negative for chest pain, palpitations and leg swelling.  Gastrointestinal:  Negative for abdominal pain and blood in stool.  Genitourinary:  Negative for dysuria.  Musculoskeletal:  Negative for myalgias.  Skin:  Negative for rash.  Neurological:  Negative for dizziness and headaches.  Endo/Heme/Allergies:  Does not bruise/bleed easily.  Psychiatric/Behavioral:  Negative for depression. The patient is not nervous/anxious.      OBJECTIVE Physical Exam: Vitals:   02/04/23 0835  BP: (!) 159/84  Pulse: 70  Weight: 146 lb (66.2 kg)  Height: 5' 6.1" (1.679 m)    Physical Exam Constitutional:      General: She is not in acute distress. Pulmonary:     Effort: Pulmonary effort is normal.  Abdominal:     General: There is no distension.     Palpations: Abdomen is soft.     Tenderness: There is no abdominal tenderness. There is no rebound.  Musculoskeletal:        General: No swelling. Normal range of motion.  Skin:    General: Skin is warm and dry.     Findings: No rash.  Neurological:      Mental Status: She is alert and oriented to person, place, and time.  Psychiatric:        Mood and Affect: Mood normal.        Behavior: Behavior normal.      GU / Detailed Urogynecologic Evaluation:  Pelvic Exam: Normal external female genitalia; Bartholin's and Skene's glands normal in appearance; urethral meatus normal in appearance, no urethral masses or discharge.   CST: negative  s/p hysterectomy:  Speculum exam reveals normal vaginal mucosa with  atrophy and normal vaginal cuff.  Adnexa no mass, fullness, tenderness.     Pelvic floor strength I/V  Pelvic floor musculature: Right levator non-tender, Right obturator non-tender, Left levator non-tender, Left obturator non-tender  POP-Q:   POP-Q  -0.5                                            Aa   -0.5                                           Ba  0                                              C   5                                            Gh  4.5                                            Pb  5                                            tvl   1.5                                            Ap  1.5                                            Bp                                                 D      Rectal Exam:  Normal external rectum  Post-Void Residual (PVR) by Bladder Scan: In order to evaluate bladder emptying, we discussed obtaining a postvoid residual and she agreed to this procedure.  Procedure: The ultrasound unit was placed on the patient's abdomen in the suprapubic region after the patient had voided. A PVR of 56 ml was obtained by bladder scan.  Laboratory Results: Unable to void to provide urine sample   ASSESSMENT AND PLAN Ms. Leano is a 62 y.o. with:  1. Prolapse of anterior vaginal wall   2. Vaginal vault prolapse after hysterectomy   3. Prolapse of posterior vaginal wall     Stage II anterior, Stage III posterior, Stage II apical prolapse - For treatment of pelvic organ  prolapse, we discussed options for management including expectant management, conservative management, and surgical management, such as Kegels, a pessary, pelvic floor physical therapy, and specific surgical procedures. - She is interested in surgery. We discussed two options for prolapse repair:  1) vaginal repair without mesh - Pros - safer, no mesh complications - Cons - not as strong as mesh repair, higher risk of recurrence  2) laparoscopic repair with mesh - Pros - stronger, better long-term success - Cons - risks of mesh implant (erosion into vagina or bladder, adhering to the rectum, pain) - these risks are lower than with a vaginal mesh but still exist - We discussed that since she has a history of previously vaginal prolapse repair, then a robotic repair with mesh would be a better procedure for her. She is in agreement.  - Will have her return for simple CMG procedure to evaluate for occult incontinence and finalize surgical plan.    Jaquita Folds, MD

## 2023-02-04 NOTE — Patient Instructions (Signed)
You have a stage 3 (out of 4) prolapse.  We discussed the fact that it is not life threatening but there are several treatment options. For treatment of pelvic organ prolapse, we discussed options for management including expectant management, conservative management, and surgical management, such as Kegels, a pessary, pelvic floor physical therapy, and specific surgical procedures.    

## 2023-02-10 ENCOUNTER — Encounter: Payer: Self-pay | Admitting: Obstetrics and Gynecology

## 2023-02-10 ENCOUNTER — Ambulatory Visit: Payer: 59 | Admitting: Obstetrics and Gynecology

## 2023-02-10 VITALS — BP 155/81 | HR 62

## 2023-02-10 DIAGNOSIS — N816 Rectocele: Secondary | ICD-10-CM

## 2023-02-10 DIAGNOSIS — N993 Prolapse of vaginal vault after hysterectomy: Secondary | ICD-10-CM

## 2023-02-10 DIAGNOSIS — N393 Stress incontinence (female) (male): Secondary | ICD-10-CM

## 2023-02-10 DIAGNOSIS — N8110 Cystocele, unspecified: Secondary | ICD-10-CM

## 2023-02-10 NOTE — Patient Instructions (Signed)
Exam under anesthesia, robotic assisted sacrocolpopexy, possible posterior repair and perineorrhaphy, midurethral sling, cystoscopy

## 2023-02-10 NOTE — Progress Notes (Signed)
[facility] Return Visit  SUBJECTIVE  History of Present Illness: Gabrielle Rodriguez is a 62 y.o. female seen in follow-up for prolapse. Plan today is for simple CMG to look for occult incontinence.   Past Medical History: Patient  has a past medical history of Elevated cholesterol, Prediabetes, and SVD (spontaneous vaginal delivery).   Past Surgical History: She  has a past surgical history that includes Eye surgery; Cesarean section; Wisdom tooth extraction; Vaginal hysterectomy (N/A, 07/29/2013); Cystocele repair (N/A, 07/29/2013); Colonoscopy (N/A, 11/20/2014); Knee arthroscopy with medial menisectomy (Left, 09/21/2015); Bunionectomy (Right); and Cervical spine surgery (08/2019).   Medications: She has a current medication list which includes the following prescription(s): atorvastatin, azithromycin, benzonatate, metformin, ninjacof, and pantoprazole.   Allergies: Patient has No Known Allergies.   Social History: Patient  reports that she has never smoked. She has never used smokeless tobacco. She reports current alcohol use. She reports that she does not use drugs.      OBJECTIVE     Physical Exam: Vitals:   02/10/23 1251 02/10/23 1252  BP: (!) 164/81 (!) 155/81  Pulse: 68 62   Gen: No apparent distress, A&O x 3.  Detailed Urogynecologic Evaluation:  Deferred. Prior exam showed:   POP-Q (02/04/23)   -0.5                                            Aa   -0.5                                           Ba   0                                              C    5                                            Gh   4.5                                            Pb   5                                            tvl    1.5                                            Ap   1.5                                            Bp  D     Verbal consent was obtained to perform simple CMG procedure:   Prolapse was reduced  using 2 large cotton swabs. Urethra was prepped with betadine and a 72F catheter was placed and bladder was drained completely. The bladder was then backfilled with sterile water by gravity.  First sensation: 38ml First Desire: 136ml Strong Desire: 174ml Capacity: 323ml Cough stress test was positive. Valsalva stress test was not done.  Catheter was replaced to drain bladder.   Interpretation: CMG showed normal sensation, and normal cystometric capacity. Findings positive for stress incontinence, negative for detrusor overactivity.     ASSESSMENT AND PLAN    [name] is a 62 y.o. with:  1. Prolapse of anterior vaginal wall   2. Prolapse of posterior vaginal wall   3. Vaginal vault prolapse after hysterectomy   4. SUI (stress urinary incontinence, female)      Plan for surgery: Exam under anesthesia, robotic assisted sacrocolpopexy, midurethral sling, cystoscopy, possible posterior repair and perineorrhaphy  - We reviewed the patient's specific anatomic and functional findings, with the assistance of diagrams, and together finalized the above procedure. The planned surgical procedures were discussed along with the surgical risks outlined below, which were also provided on a detailed handout. Additional treatment options including expectant management, conservative management, medical management were discussed where appropriate.  We reviewed the benefits and risks of each treatment option.   General Surgical Risks: For all procedures, there are risks of bleeding, infection, damage to surrounding organs including but not limited to bowel, bladder, blood vessels, ureters and nerves, and need for further surgery if an injury were to occur. These risks are all low with minimally invasive surgery.   There are risks of numbness and weakness at any body site or buttock/rectal pain.  It is possible that baseline pain can be worsened by surgery, either with or without mesh. If surgery is  vaginal, there is also a low risk of possible conversion to laparoscopy or open abdominal incision where indicated. Very rare risks include blood transfusion, blood clot, heart attack, pneumonia, or death.   There is also a risk of short-term postoperative urinary retention with need to use a catheter. About half of patients need to go home from surgery with a catheter, which is then later removed in the office. The risk of long-term need for a catheter is very low. There is also a risk of worsening of overactive bladder.   Sling: The effectiveness of a midurethral vaginal mesh sling is approximately 85%, and thus, there will be times when you may leak urine after surgery, especially if your bladder is full or if you have a strong cough. There is a balance between making the sling tight enough to treat your leakage but not too tight so that you have long-term difficulty emptying your bladder. A mesh sling will not directly treat overactive bladder/urge incontinence and may worsen it.  There is an FDA safety notification on vaginal mesh procedures for prolapse but NOT mesh slings. We have extensive experience and training with mesh placement and we have close postoperative follow up to identify any potential complications from mesh. It is important to realize that this mesh is a permanent implant that cannot be easily removed. There are rare risks of mesh exposure (2-4%), pain with intercourse (0-7%), and infection (<1%). The risk of mesh exposure if more likely in a woman with risks for poor healing (prior radiation, poorly controlled diabetes, or immunocompromised). The risk of new or worsened chronic  pain after mesh implant is more common in women with baseline chronic pain and/or poorly controlled anxiety or depression. Approximately 2-4% of patients will experience longer-term post-operative voiding dysfunction that may require surgical revision of the sling. We also reviewed that postoperatively, her  stream may not be as strong as before surgery.   Prolapse (with or without mesh): Risk factors for surgical failure  include things that put pressure on your pelvis and the surgical repair, including obesity, chronic cough, and heavy lifting or straining (including lifting children or adults, straining on the toilet, or lifting heavy objects such as furniture or anything weighing >25 lbs. Risks of recurrence is 20-30% with vaginal native tissue repair and a less than 10% with sacrocolpopexy with mesh.    Sacrocolpopexy: Mesh implants may provide more prolapse support, but do have some unique risks to consider. It is important to understand that mesh is permanent and cannot be easily removed. Risks of abdominal sacrocolpopexy mesh include mesh exposure (~3-6%), painful intercourse (recent studies show lower rates after surgery compared to before, with ~5-8% risk of new onset), and very rare risks of bowel or bladder injury or infection (<1%). The risk of mesh exposure is more likely in a woman with risks for poor healing (prior radiation, poorly controlled diabetes, or immunocompromised). The risk of new or worsened chronic pain after mesh implant is more common in women with baseline chronic pain and/or poorly controlled anxiety or depression. There is an FDA safety notification on vaginal mesh procedures for prolapse but NOT abdominal mesh procedures and therefore does not apply to your surgery. We have extensive experience and training with mesh placement and we have close postoperative follow up to identify any potential complications from mesh.    - For preop Visit:  She is required to have a visit within 30 days of her surgery.   - Medical clearance: not required  - Anticoagulant use: No - Medicaid Hysterectomy form: n/a - Accepts blood transfusion: Yes - Expected length of stay: outpatient  Request sent for surgery scheduling.   Jaquita Folds, MD

## 2023-06-11 ENCOUNTER — Encounter: Payer: Self-pay | Admitting: Obstetrics and Gynecology

## 2023-06-11 ENCOUNTER — Ambulatory Visit (INDEPENDENT_AMBULATORY_CARE_PROVIDER_SITE_OTHER): Payer: 59 | Admitting: Obstetrics and Gynecology

## 2023-06-11 VITALS — BP 176/89 | HR 54 | Wt 145.4 lb

## 2023-06-11 DIAGNOSIS — Z01818 Encounter for other preprocedural examination: Secondary | ICD-10-CM

## 2023-06-11 MED ORDER — IBUPROFEN 600 MG PO TABS: 600.0000 mg | ORAL_TABLET | Freq: Four times a day (QID) | ORAL | 0 refills | Status: DC | PRN

## 2023-06-11 MED ORDER — POLYETHYLENE GLYCOL 3350 17 GM/SCOOP PO POWD: 17.0000 g | Freq: Every day | ORAL | 0 refills | Status: DC

## 2023-06-11 MED ORDER — ACETAMINOPHEN 500 MG PO TABS: 500.0000 mg | ORAL_TABLET | Freq: Four times a day (QID) | ORAL | 0 refills | Status: AC | PRN

## 2023-06-11 MED ORDER — OXYCODONE HCL 5 MG PO TABS: 5.0000 mg | ORAL_TABLET | ORAL | 0 refills | Status: DC | PRN

## 2023-06-11 NOTE — Progress Notes (Signed)
[facility] Pre-Operative Exam  Subjective Chief Complaint: Gabrielle Rodriguez presents for a preoperative encounter.   History of Present Illness: Gabrielle Rodriguez is a 62 y.o. female who presents for preoperative visit.  She is scheduled to undergo Exam under anesthesia, Robotic assisted laparoscopic sacrocolpopexy, Perineoplasty, Mid-urethral sling, and Cystoscopy on 07/07/23.  Her symptoms include Pelvic organ prolapse and stress urinary incontinence, and she was was found to have Stage II anterior, Stage III posterior, Stage II apical prolapse.   Urodynamics showed: CMG showed normal sensation, and normal cystometric capacity. Findings positive for stress incontinence, negative for detrusor overactivity.   Past Medical History:  Diagnosis Date   Elevated cholesterol    Prediabetes    SVD (spontaneous vaginal delivery)    x 1     Past Surgical History:  Procedure Laterality Date   BUNIONECTOMY Right    CERVICAL SPINE SURGERY  08/2019   CESAREAN SECTION     x 1   COLONOSCOPY N/A 11/20/2014   Procedure: COLONOSCOPY;  Surgeon: West Bali, MD;  Location: [facility];  Service: Endoscopy;  Laterality: N/A;  1:30 PM - moved to 1/11 @ 11:30 - Doris to notify pt   CYSTOCELE REPAIR N/A 07/29/2013   Procedure: ANTERIOR REPAIR (CYSTOCELE);  Surgeon: Genia Del, MD;  Location: [facility];  Service: Gynecology;  Laterality: N/A;   EYE SURGERY     eye surgery at age 73 yrs   KNEE ARTHROSCOPY WITH MEDIAL MENISECTOMY Left 09/21/2015   Procedure: KNEE ARTHROSCOPY WITH PARTIAL MEDIAL MENISECTOMY;  Surgeon: Vickki Hearing, MD;  Location: [facility];  Service: Orthopedics;  Laterality: Left;   VAGINAL HYSTERECTOMY N/A 07/29/2013   Procedure: HYSTERECTOMY VAGINAL with McCalls;  Surgeon: Genia Del, MD;  Location: [facility];  Service: Gynecology;  Laterality: N/A;   WISDOM TOOTH EXTRACTION      has No Known Allergies.   Family History  Problem Relation Age of Onset   Cancer  Mother        lung   Breast cancer Maternal Grandmother    Diabetes Maternal Grandfather    Diabetes Paternal Grandmother    Hypertension Paternal Grandfather     Social History   Tobacco Use   Smoking status: Never   Smokeless tobacco: Never  Vaping Use   Vaping status: Never Used  Substance Use Topics   Alcohol use: Yes    Comment: socially   Drug use: No     Review of Systems was negative for a full 10 system review except as noted in the History of Present Illness.   Current Outpatient Medications:    atorvastatin (LIPITOR) 40 MG tablet, Take 1 tablet by mouth daily., Disp: , Rfl:    azithromycin (ZITHROMAX) 250 MG tablet, Take 250 mg by mouth as directed., Disp: , Rfl:    benzonatate (TESSALON) 100 MG capsule, Take 100-200 mg by mouth 3 (three) times daily as needed., Disp: , Rfl:    DICLOFENAC PO, Take by mouth., Disp: , Rfl:    metFORMIN (GLUCOPHAGE-XR) 500 MG 24 hr tablet, Take 500 mg by mouth daily., Disp: , Rfl:    NINJACOF 12.5-12.5 MG/5ML LIQD, Take by mouth., Disp: , Rfl:    pantoprazole (PROTONIX) 40 MG tablet, Take by mouth., Disp: , Rfl:    Objective Vitals:   06/11/23 1427  BP: (!) 159/99  Pulse: (!) 57    Gen: NAD CV: S1 S2 RRR Lungs: Clear to auscultation bilaterally Abd: soft, nontender   Previous Pelvic Exam  showed: Pelvic Exam: Normal external female genitalia; Bartholin's and Skene's glands normal in appearance; urethral meatus normal in appearance, no urethral masses or discharge.    CST: negative   s/p hysterectomy: Speculum exam reveals normal vaginal mucosa with  atrophy and normal vaginal cuff.  Adnexa no mass, fullness, tenderness.       Pelvic floor strength I/V   Pelvic floor musculature: Right levator non-tender, Right obturator non-tender, Left levator non-tender, Left obturator non-tender   POP-Q:    POP-Q   -0.5                                            Aa   -0.5                                           Ba   0                                               C    5                                            Gh   4.5                                            Pb   5                                            tvl    1.5                                            Ap   1.5                                            Bp                                                  D          Rectal Exam:  Normal external rectum    Assessment/ Plan  Assessment: The patient is a 62 y.o. year old scheduled to undergo Exam under anesthesia, Robotic assisted laparoscopic sacrocolpopexy, Perineoplasty, Mid-urethral sling, and Cystoscopy. Verbal consent was obtained for these procedures.  Plan: General Surgical Consent: The patient has previously been counseled on alternative treatments, and the decision by the patient and provider was to proceed with the procedure listed above.  For  all procedures, there are risks of bleeding, infection, damage to surrounding organs including but not limited to bowel, bladder, blood vessels, ureters and nerves, and need for further surgery if an injury were to occur. These risks are all low with minimally invasive surgery.   There are risks of numbness and weakness at any body site or buttock/rectal pain.  It is possible that baseline pain can be worsened by surgery, either with or without mesh. If surgery is vaginal, there is also a low risk of possible conversion to laparoscopy or open abdominal incision where indicated. Very rare risks include blood transfusion, blood clot, heart attack, pneumonia, or death.   There is also a risk of short-term postoperative urinary retention with need to use a catheter. About half of patients need to go home from surgery with a catheter, which is then later removed in the office. The risk of long-term need for a catheter is very low. There is also a risk of worsening of overactive bladder.   Sling: The effectiveness of a midurethral vaginal  mesh sling is approximately 85%, and thus, there will be times when you may leak urine after surgery, especially if your bladder is full or if you have a strong cough. There is a balance between making the sling tight enough to treat your leakage but not too tight so that you have long-term difficulty emptying your bladder. A mesh sling will not directly treat overactive bladder/urge incontinence and may worsen it.  There is an FDA safety notification on vaginal mesh procedures for prolapse but NOT mesh slings. We have extensive experience and training with mesh placement and we have close postoperative follow up to identify any potential complications from mesh. It is important to realize that this mesh is a permanent implant that cannot be easily removed. There are rare risks of mesh exposure (2-4%), pain with intercourse (0-7%), and infection (<1%). The risk of mesh exposure if more likely in a woman with risks for poor healing (prior radiation, poorly controlled diabetes, or immunocompromised). The risk of new or worsened chronic pain after mesh implant is more common in women with baseline chronic pain and/or poorly controlled anxiety or depression. Approximately 2-4% of patients will experience longer-term post-operative voiding dysfunction that may require surgical revision of the sling. We also reviewed that postoperatively, her stream may not be as strong as before surgery.   Prolapse (with or without mesh): Risk factors for surgical failure  include things that put pressure on your pelvis and the surgical repair, including obesity, chronic cough, and heavy lifting or straining (including lifting children or adults, straining on the toilet, or lifting heavy objects such as furniture or anything weighing >25 lbs. Risks of recurrence is 20-30% with vaginal native tissue repair and a less than 10% with sacrocolpopexy with mesh.    Sacrocolpopexy: Mesh implants may provide more prolapse support, but do  have some unique risks to consider. It is important to understand that mesh is permanent and cannot be easily removed. Risks of abdominal sacrocolpopexy mesh include mesh exposure (~3-6%), painful intercourse (recent studies show lower rates after surgery compared to before, with ~5-8% risk of new onset), and very rare risks of bowel or bladder injury or infection (<1%). The risk of mesh exposure is more likely in a woman with risks for poor healing (prior radiation, poorly controlled diabetes, or immunocompromised). The risk of new or worsened chronic pain after mesh implant is more common in women with baseline chronic pain and/or poorly controlled  anxiety or depression. There is an FDA safety notification on vaginal mesh procedures for prolapse but NOT abdominal mesh procedures and therefore does not apply to your surgery. We have extensive experience and training with mesh placement and we have close postoperative follow up to identify any potential complications from mesh.    We discussed consent for blood products. Risks for blood transfusion include allergic reactions, other reactions that can affect different body organs and managed accordingly, transmission of infectious diseases such as HIV or Hepatitis. However, the blood is screened. Patient consents for blood products.  Pre-operative instructions:  She was instructed to not take Aspirin/NSAIDs x 7days prior to surgery. Antibiotic prophylaxis was ordered as indicated.  Catheter use: Patient will go home with foley if needed after post-operative voiding trial.  Post-operative instructions:  She was provided with specific post-operative instructions, including precautions and signs/symptoms for which we would recommend contacting us, in addition to daytime and after-hours contact phone numbers. This was provided on a handout.   Post-operative medications: Prescriptions for motrin, tylenol, miralax, and oxycodone were sent to her pharmacy.  Discussed using ibuprofen and tylenol on a schedule to limit use of narcotics.   Laboratory testing:  We will check labs as requested by anesthesia.   Preoperative clearance:  She does not require surgical clearance.    Post-operative follow-up:  A post-operative appointment will be made for 6 weeks from the date of surgery. If she needs a post-operative nurse visit for a voiding trial, that will be set up after she leaves the hospital.    Patient will call the clinic or use MyChart should anything change or any new issues arise.   Selmer Dominion, NP

## 2023-06-12 ENCOUNTER — Encounter: Payer: Self-pay | Admitting: Obstetrics and Gynecology

## 2023-06-12 NOTE — H&P (Signed)
[facility] H&P  Subjective Chief Complaint: Gabrielle Rodriguez presents for a preoperative encounter.   History of Present Illness: Gabrielle Rodriguez is a 62 y.o. female who presents for preoperative visit.  She is scheduled to undergo Exam under anesthesia, Robotic assisted laparoscopic sacrocolpopexy, Perineoplasty, Mid-urethral sling, and Cystoscopy on 07/07/23.  Her symptoms include Pelvic organ prolapse and stress urinary incontinence, and she was was found to have Stage II anterior, Stage III posterior, Stage II apical prolapse.   Urodynamics showed: CMG showed normal sensation, and normal cystometric capacity. Findings positive for stress incontinence, negative for detrusor overactivity.   Past Medical History:  Diagnosis Date   Elevated cholesterol    Prediabetes    SVD (spontaneous vaginal delivery)    x 1     Past Surgical History:  Procedure Laterality Date   BUNIONECTOMY Right    CERVICAL SPINE SURGERY  08/2019   CESAREAN SECTION     x 1   COLONOSCOPY N/A 11/20/2014   Procedure: COLONOSCOPY;  Surgeon: West Bali, MD;  Location: [facility];  Service: Endoscopy;  Laterality: N/A;  1:30 PM - moved to 1/11 @ 11:30 - Doris to notify pt   CYSTOCELE REPAIR N/A 07/29/2013   Procedure: ANTERIOR REPAIR (CYSTOCELE);  Surgeon: Genia Del, MD;  Location: [facility];  Service: Gynecology;  Laterality: N/A;   EYE SURGERY     eye surgery at age 46 yrs   KNEE ARTHROSCOPY WITH MEDIAL MENISECTOMY Left 09/21/2015   Procedure: KNEE ARTHROSCOPY WITH PARTIAL MEDIAL MENISECTOMY;  Surgeon: Vickki Hearing, MD;  Location: [facility];  Service: Orthopedics;  Laterality: Left;   VAGINAL HYSTERECTOMY N/A 07/29/2013   Procedure: HYSTERECTOMY VAGINAL with McCalls;  Surgeon: Genia Del, MD;  Location: [facility];  Service: Gynecology;  Laterality: N/A;   WISDOM TOOTH EXTRACTION      has No Known Allergies.   Family History  Problem Relation Age of Onset   Cancer Mother         lung   Breast cancer Maternal Grandmother    Diabetes Maternal Grandfather    Diabetes Paternal Grandmother    Hypertension Paternal Grandfather     Social History   Tobacco Use   Smoking status: Never   Smokeless tobacco: Never  Vaping Use   Vaping status: Never Used  Substance Use Topics   Alcohol use: Yes    Comment: socially   Drug use: No     Review of Systems was negative for a full 10 system review except as noted in the History of Present Illness.  No current facility-administered medications for this encounter.  Current Outpatient Medications:    acetaminophen (TYLENOL) 500 MG tablet, Take 1 tablet (500 mg total) by mouth every 6 (six) hours as needed (pain)., Disp: 30 tablet, Rfl: 0   atorvastatin (LIPITOR) 40 MG tablet, Take 1 tablet by mouth daily., Disp: , Rfl:    azithromycin (ZITHROMAX) 250 MG tablet, Take 250 mg by mouth as directed., Disp: , Rfl:    benzonatate (TESSALON) 100 MG capsule, Take 100-200 mg by mouth 3 (three) times daily as needed., Disp: , Rfl:    DICLOFENAC PO, Take by mouth., Disp: , Rfl:    ibuprofen (ADVIL) 600 MG tablet, Take 1 tablet (600 mg total) by mouth every 6 (six) hours as needed., Disp: 30 tablet, Rfl: 0   metFORMIN (GLUCOPHAGE-XR) 500 MG 24 hr tablet, Take 500 mg by mouth daily., Disp: , Rfl:    NINJACOF 12.5-12.5 MG/5ML LIQD,  Take by mouth., Disp: , Rfl:    oxyCODONE (OXY IR/ROXICODONE) 5 MG immediate release tablet, Take 1 tablet (5 mg total) by mouth every 4 (four) hours as needed for severe pain., Disp: 15 tablet, Rfl: 0   pantoprazole (PROTONIX) 40 MG tablet, Take by mouth., Disp: , Rfl:    polyethylene glycol powder (GLYCOLAX/MIRALAX) 17 GM/SCOOP powder, Take 17 g by mouth daily. Drink 17g (1 scoop) dissolved in water per day., Disp: 255 g, Rfl: 0   Objective There were no vitals filed for this visit.   Gen: NAD CV: S1 S2 RRR Lungs: Clear to auscultation bilaterally Abd: soft, nontender   Previous Pelvic Exam  showed: Pelvic Exam: Normal external female genitalia; Bartholin's and Skene's glands normal in appearance; urethral meatus normal in appearance, no urethral masses or discharge.    CST: negative   s/p hysterectomy: Speculum exam reveals normal vaginal mucosa with  atrophy and normal vaginal cuff.  Adnexa no mass, fullness, tenderness.       Pelvic floor strength I/V   Pelvic floor musculature: Right levator non-tender, Right obturator non-tender, Left levator non-tender, Left obturator non-tender   POP-Q:    POP-Q   -0.5                                            Aa   -0.5                                           Ba   0                                              C    5                                            Gh   4.5                                            Pb   5                                            tvl    1.5                                            Ap   1.5                                            Bp  D          Rectal Exam:  Normal external rectum    Assessment/ Plan  Assessment: The patient is a 62 y.o. year old scheduled to undergo Exam under anesthesia, Robotic assisted laparoscopic sacrocolpopexy, Perineoplasty, Mid-urethral sling, and Cystoscopy. Verbal consent was obtained for these procedures.

## 2023-06-26 ENCOUNTER — Encounter (HOSPITAL_BASED_OUTPATIENT_CLINIC_OR_DEPARTMENT_OTHER): Payer: Self-pay | Admitting: Obstetrics and Gynecology

## 2023-06-26 NOTE — Progress Notes (Signed)
Spoke w/ via phone for pre-op interview--- pt Lab needs dos----   [facility] Rx results------ no COVID test -----patient states asymptomatic no test needed Arrive at ------- 0530 on 07-07-2023 NPO after MN NO Solid Food.  Clear liquids from MN until--- 0430 Med rec completed Medications to take morning of surgery ----- protonix Diabetic medication -----  pt takes in the evening Patient instructed no nail polish to be worn day of surgery Patient instructed to bring photo id and insurance card day of surgery Patient aware to have Driver (ride ) / caregiver    for 24 hours after surgery -- husband, Gabrielle Rodriguez Patient Special Instructions ----- n/a Pre-Op special Instructions ----- n/a Patient verbalized understanding of instructions that were given at this phone interview. Patient denies shortness of breath, chest pain, fever, cough at this phone interview.

## 2023-07-06 ENCOUNTER — Ambulatory Visit
Admission: EM | Admit: 2023-07-06 | Discharge: 2023-07-06 | Disposition: A | Discharge status: Home / Self Care | Payer: 59 | Attending: Nurse Practitioner | Admitting: Nurse Practitioner

## 2023-07-06 DIAGNOSIS — T7840XA Allergy, unspecified, initial encounter: Secondary | ICD-10-CM | POA: Diagnosis not present

## 2023-07-06 DIAGNOSIS — T63441A Toxic effect of venom of bees, accidental (unintentional), initial encounter: Secondary | ICD-10-CM

## 2023-07-06 MED ORDER — PREDNISONE 20 MG PO TABS: 40.0000 mg | ORAL_TABLET | Freq: Every day | ORAL | 0 refills | Status: DC

## 2023-07-06 MED ORDER — DIPHENHYDRAMINE HCL 50 MG/ML IJ SOLN
50.0000 mg | Freq: Once | INTRAMUSCULAR | Status: AC
  Administered 2023-07-06: 50 mg via INTRAMUSCULAR

## 2023-07-06 NOTE — Discharge Instructions (Signed)
You were given an injection of Benadryl 50 mg today.  You can start the next dose of Benadryl in approximately 6 hours. May take over-the-counter Tylenol as needed for pain or discomfort. Cool compresses to the eyes to help with pain and swelling.  Apply for 20 minutes, remove for 1 hour, repeat is much as possible. As discussed, please follow-up with your surgeons office regarding use of the prednisone to determine if it is safe for you to take this prior to your scheduled procedure. Go to the emergency department immediately if you experience worsening eye swelling, tongue swelling, lip swelling, throat swelling, shortness of breath, difficulty breathing, or other concerns. Follow-up as needed.

## 2023-07-06 NOTE — ED Provider Notes (Signed)
[facility]    CSN: [id number] Arrival date & time: 07/06/23  0803      History   Chief Complaint No chief complaint on file.   HPI Gabrielle Rodriguez is a 62 y.o. female.   The history is provided by the patient.   Patient presents with a bee sting to the right eye that occurred around 4:30 PM 1 day ago.  Patient states after the sting, she now has swelling to both her eyes.  She denies fever, chills, shortness of breath, difficulty breathing, wheezing, chest tightness, lip swelling, tongue swelling, or throat swelling.  Patient reports she took 2 Benadryl after the incident.  Patient advises that she is scheduled to have a surgical procedure on 07/07/2023.  Past Medical History:  Diagnosis Date   Diverticulosis of colon    GERD (gastroesophageal reflux disease)    History of cold sores    Hyperlipidemia, mixed    Insomnia    OA (osteoarthritis)    knees   Pre-diabetes    Prolapse of female pelvic organs    SUI (stress urinary incontinence, female)    Wears contact lenses    right eye only for reading    Patient Active Problem List   Diagnosis Date Noted   Pre-diabetes 02/04/2023   Medial meniscus, posterior horn derangement    Special screening for malignant neoplasms, colon    ACL laxity 07/24/2011   Knee pain 07/24/2011    Past Surgical History:  Procedure Laterality Date   ANTERIOR CERVICAL DECOMP/DISCECTOMY FUSION  08/2019   @ [facility] by dr Marikay Alar;  C4--5   BUNIONECTOMY Right    1990s   CESAREAN SECTION  1996   COLONOSCOPY N/A 11/20/2014   Procedure: COLONOSCOPY;  Surgeon: West Bali, MD;  Location: [facility];  Service: Endoscopy;  Laterality: N/A;  1:30 PM - moved to 1/11 @ 11:30 - Doris to notify pt   CYSTOCELE REPAIR N/A 07/29/2013   Procedure: ANTERIOR REPAIR (CYSTOCELE);  Surgeon: Genia Del, MD;  Location: [facility];  Service: Gynecology;  Laterality: N/A;   KNEE ARTHROSCOPY WITH MEDIAL MENISECTOMY Left 09/21/2015   Procedure:  KNEE ARTHROSCOPY WITH PARTIAL MEDIAL MENISECTOMY;  Surgeon: Vickki Hearing, MD;  Location: [facility];  Service: Orthopedics;  Laterality: Left;   STRABISMUS SURGERY  1963   VAGINAL HYSTERECTOMY N/A 07/29/2013   Procedure: HYSTERECTOMY VAGINAL with McCalls;  Surgeon: Genia Del, MD;  Location: [facility];  Service: Gynecology;  Laterality: N/A;   WISDOM TOOTH EXTRACTION      OB History     Gravida  4   Para  2   Term      Preterm      AB  2   Living  2      SAB  2   IAB      Ectopic      Multiple      Live Births               Home Medications    Prior to Admission medications   Medication Sig Start Date End Date Taking? Authorizing Provider  predniSONE (DELTASONE) 20 MG tablet Take 2 tablets (40 mg total) by mouth daily with breakfast for 5 days. 07/06/23 07/11/23 Yes Nubia Ziesmer-Warren, Sadie Haber, NP  acetaminophen (TYLENOL) 500 MG tablet Take 1 tablet (500 mg total) by mouth every 6 (six) hours as needed (pain). Patient not taking: Reported on 06/26/2023 06/11/23   Selmer Dominion, NP  atorvastatin (LIPITOR)  40 MG tablet Take 1 tablet by mouth every evening. 08/22/22   [provider]  diclofenac (VOLTAREN) 75 MG EC tablet Take 75 mg by mouth 2 (two) times daily.    [provider]  ibuprofen (ADVIL) 600 MG tablet Take 1 tablet (600 mg total) by mouth every 6 (six) hours as needed. Patient not taking: Reported on 06/26/2023 06/11/23   Selmer Dominion, NP  metFORMIN (GLUCOPHAGE-XR) 500 MG 24 hr tablet Take 500 mg by mouth every evening. 10/09/21   [provider]  oxyCODONE (OXY IR/ROXICODONE) 5 MG immediate release tablet Take 1 tablet (5 mg total) by mouth every 4 (four) hours as needed for severe pain. Patient not taking: Reported on 06/26/2023 06/11/23   Selmer Dominion, NP  pantoprazole (PROTONIX) 40 MG tablet Take 40 mg by mouth daily. 11/13/22   [provider]  polyethylene glycol powder (GLYCOLAX/MIRALAX) 17 GM/SCOOP powder  Take 17 g by mouth daily. Drink 17g (1 scoop) dissolved in water per day. Patient not taking: Reported on 06/26/2023 06/11/23   Selmer Dominion, NP  valACYclovir (VALTREX) 1000 MG tablet Take 1,000 mg by mouth 3 (three) times daily with meals as needed (cold sores).    [provider]    Family History Family History  Problem Relation Age of Onset   Cancer Mother        lung   Breast cancer Maternal Grandmother    Diabetes Maternal Grandfather    Diabetes Paternal Grandmother    Hypertension Paternal Grandfather     Social History Social History   Tobacco Use   Smoking status: Never   Smokeless tobacco: Never  Vaping Use   Vaping status: Never Used  Substance Use Topics   Alcohol use: Yes    Comment: socially   Drug use: Never     Allergies   Patient has no known allergies.   Review of Systems Review of Systems Per HPI  Physical Exam Triage Vital Signs ED Triage Vitals [07/06/23 0830]  Encounter Vitals Group     BP (!) 187/99     Systolic BP Percentile      Diastolic BP Percentile      Pulse Rate 64     Resp 13     Temp 97.8 F (36.6 C)     Temp Source Oral     SpO2 98 %     Weight      Height      Head Circumference      Peak Flow      Pain Score 5     Pain Loc      Pain Education      Exclude from Growth Chart    No data found.  Updated Vital Signs BP (!) 187/99 (BP Location: Right Arm)   Pulse 64   Temp 97.8 F (36.6 C) (Oral)   Resp 13   LMP 07/03/2011 Comment: SA  SpO2 98%   Visual Acuity Right Eye Distance:   Left Eye Distance:   Bilateral Distance:    Right Eye Near:   Left Eye Near:    Bilateral Near:     Physical Exam Vitals and nursing note reviewed.  Constitutional:      General: She is not in acute distress.    Appearance: Normal appearance.  HENT:     Head: Normocephalic.     Right Ear: Tympanic membrane, ear canal and external ear normal.     Left Ear: Tympanic membrane, ear canal  and external ear normal.      Nose: Nose normal.     Mouth/Throat:     Mouth: Mucous membranes are moist.  Eyes:     General: Vision grossly intact. No visual field deficit.       Right eye: No foreign body, discharge or hordeolum.        Left eye: No foreign body, discharge or hordeolum.     Extraocular Movements: Extraocular movements intact.     Right eye: Normal extraocular motion and no nystagmus.     Left eye: Normal extraocular motion and no nystagmus.     Conjunctiva/sclera: Conjunctivae normal.     Right eye: Right conjunctiva is not injected. No chemosis, exudate or hemorrhage.    Left eye: Left conjunctiva is not injected. No chemosis or hemorrhage.    Pupils: Pupils are equal, round, and reactive to light.     Comments: Periorbital swelling present bilaterally.  Cardiovascular:     Rate and Rhythm: Normal rate and regular rhythm.     Pulses: Normal pulses.     Heart sounds: Normal heart sounds.  Pulmonary:     Effort: Pulmonary effort is normal.     Breath sounds: Normal breath sounds.  Abdominal:     General: Bowel sounds are normal.     Palpations: Abdomen is soft.     Tenderness: There is no abdominal tenderness.  Musculoskeletal:     Cervical back: Normal range of motion.  Skin:    General: Skin is warm and dry.  Neurological:     General: No focal deficit present.     Mental Status: She is alert and oriented to person, place, and time.  Psychiatric:        Mood and Affect: Mood normal.        Behavior: Behavior normal.      UC Treatments / Results  Labs (all labs ordered are listed, but only abnormal results are displayed) Labs Reviewed - No data to display  EKG   Radiology No results found.  Procedures Procedures (including critical care time)  Medications Ordered in UC Medications  diphenhydrAMINE (BENADRYL) injection 50 mg (50 mg Intramuscular Given 07/06/23 0903)    Initial Impression / Assessment and Plan / UC Course  I have reviewed the triage vital signs and  the nursing notes.  Pertinent labs & imaging results that were available during my care of the patient were reviewed by me and considered in my medical decision making (see chart for details).  The patient is well-appearing, she is in no acute distress, she is hypertensive, but vital signs are otherwise stable.  On exam, patient does have moderate swelling to the periorbital areas around both eyes.  Given that she has not up coming surgical procedure, she does not want to jeopardize that.  Patient was given Benadryl 50 mg IM.  Patient was also prescribed prednisone 40 mg but advised to follow-up with her surgeon prior to starting the medication.  Patient was given supportive care recommendations to include over-the-counter analgesics, cool compresses to the eyes, and to continue Benadryl as discussed.  Patient was also given strict ER follow-up precautions.  Patient is in agreement with this plan of care and verbalizes understanding.  All questions were answered.  Patient stable for discharge.  Final Clinical Impressions(s) / UC Diagnoses   Final diagnoses:  Allergic reaction, initial encounter  Bee sting, accidental or unintentional, initial encounter     Discharge Instructions      You  were given an injection of Benadryl 50 mg today.  You can start the next dose of Benadryl in approximately 6 hours. May take over-the-counter Tylenol as needed for pain or discomfort. Cool compresses to the eyes to help with pain and swelling.  Apply for 20 minutes, remove for 1 hour, repeat is much as possible. As discussed, please follow-up with your surgeons office regarding use of the prednisone to determine if it is safe for you to take this prior to your scheduled procedure. Go to the emergency department immediately if you experience worsening eye swelling, tongue swelling, lip swelling, throat swelling, shortness of breath, difficulty breathing, or other concerns. Follow-up as needed.     ED  Prescriptions     Medication Sig Dispense Auth. Provider   predniSONE (DELTASONE) 20 MG tablet Take 2 tablets (40 mg total) by mouth daily with breakfast for 5 days. 10 tablet Woodard Perrell-Warren, Sadie Haber, NP      PDMP not reviewed this encounter.   Abran Cantor, NP 07/06/23 [phone]

## 2023-07-06 NOTE — ED Triage Notes (Signed)
Pt c/o bee string to the right eye, yesterday  at 4:30 pm caused facial swelling and pain, pt has been taking benadryl

## 2023-07-07 ENCOUNTER — Ambulatory Visit (HOSPITAL_BASED_OUTPATIENT_CLINIC_OR_DEPARTMENT_OTHER): Payer: 59 | Admitting: Certified Registered"

## 2023-07-07 ENCOUNTER — Ambulatory Visit (HOSPITAL_BASED_OUTPATIENT_CLINIC_OR_DEPARTMENT_OTHER)
Admission: RE | Admit: 2023-07-07 | Discharge: 2023-07-07 | Disposition: A | Discharge status: Home / Self Care | Payer: 59 | Attending: Obstetrics and Gynecology | Admitting: Obstetrics and Gynecology

## 2023-07-07 ENCOUNTER — Encounter (HOSPITAL_BASED_OUTPATIENT_CLINIC_OR_DEPARTMENT_OTHER): Payer: Self-pay | Admitting: Obstetrics and Gynecology

## 2023-07-07 ENCOUNTER — Other Ambulatory Visit: Payer: Self-pay

## 2023-07-07 ENCOUNTER — Encounter (HOSPITAL_BASED_OUTPATIENT_CLINIC_OR_DEPARTMENT_OTHER)
Admission: RE | Disposition: A | Discharge status: Home / Self Care | Payer: Self-pay | Source: Home / Self Care | Attending: Obstetrics and Gynecology

## 2023-07-07 DIAGNOSIS — N393 Stress incontinence (female) (male): Secondary | ICD-10-CM

## 2023-07-07 DIAGNOSIS — M1990 Unspecified osteoarthritis, unspecified site: Secondary | ICD-10-CM | POA: Insufficient documentation

## 2023-07-07 DIAGNOSIS — N8110 Cystocele, unspecified: Secondary | ICD-10-CM

## 2023-07-07 DIAGNOSIS — K219 Gastro-esophageal reflux disease without esophagitis: Secondary | ICD-10-CM | POA: Diagnosis not present

## 2023-07-07 DIAGNOSIS — N993 Prolapse of vaginal vault after hysterectomy: Secondary | ICD-10-CM | POA: Diagnosis present

## 2023-07-07 DIAGNOSIS — N816 Rectocele: Secondary | ICD-10-CM

## 2023-07-07 DIAGNOSIS — Z01818 Encounter for other preprocedural examination: Secondary | ICD-10-CM

## 2023-07-07 HISTORY — PX: BLADDER SUSPENSION: SHX72

## 2023-07-07 HISTORY — DX: Prediabetes: R73.03

## 2023-07-07 HISTORY — DX: Presence of spectacles and contact lenses: Z97.3

## 2023-07-07 HISTORY — DX: Gastro-esophageal reflux disease without esophagitis: K21.9

## 2023-07-07 HISTORY — DX: Insomnia, unspecified: G47.00

## 2023-07-07 HISTORY — PX: ROBOTIC ASSISTED LAPAROSCOPIC SACROCOLPOPEXY: SHX5388

## 2023-07-07 HISTORY — PX: PERINEOPLASTY: SHX2218

## 2023-07-07 HISTORY — DX: Unspecified osteoarthritis, unspecified site: M19.90

## 2023-07-07 HISTORY — DX: Mixed hyperlipidemia: E78.2

## 2023-07-07 HISTORY — PX: CYSTOSCOPY: SHX5120

## 2023-07-07 HISTORY — DX: Female genital prolapse, unspecified: N81.9

## 2023-07-07 HISTORY — DX: Diverticulosis of large intestine without perforation or abscess without bleeding: K57.30

## 2023-07-07 HISTORY — DX: Stress incontinence (female) (male): N39.3

## 2023-07-07 HISTORY — DX: Personal history of other infectious and parasitic diseases: Z86.19

## 2023-07-07 LAB — POCT I-STAT, CHEM 8
BUN: 15 mg/dL (ref 8–23)
Calcium, Ion: 1.03 mmol/L — ABNORMAL LOW (ref 1.15–1.40)
Chloride: 106 mmol/L (ref 98–111)
Creatinine, Ser: 0.7 mg/dL (ref 0.44–1.00)
Glucose, Bld: 118 mg/dL — ABNORMAL HIGH (ref 70–99)
HCT: 46 % (ref 36.0–46.0)
Hemoglobin: 15.6 g/dL — ABNORMAL HIGH (ref 12.0–15.0)
Potassium: 4.2 mmol/L (ref 3.5–5.1)
Sodium: 139 mmol/L (ref 135–145)
TCO2: 23 mmol/L (ref 22–32)

## 2023-07-07 SURGERY — SACROCOLPOPEXY, ROBOT-ASSISTED, LAPAROSCOPIC
Anesthesia: General | Site: Vagina

## 2023-07-07 MED ORDER — EPHEDRINE SULFATE-NACL 50-0.9 MG/10ML-% IV SOSY
PREFILLED_SYRINGE | INTRAVENOUS | Status: DC | PRN
  Administered 2023-07-07: 10 mg via INTRAVENOUS

## 2023-07-07 MED ORDER — PHENAZOPYRIDINE HCL 100 MG PO TABS
200.0000 mg | ORAL_TABLET | ORAL | Status: AC
  Administered 2023-07-07: 200 mg via ORAL

## 2023-07-07 MED ORDER — OXYCODONE HCL 5 MG PO TABS
ORAL_TABLET | ORAL | Status: AC
  Filled 2023-07-07: qty 1

## 2023-07-07 MED ORDER — ONDANSETRON HCL 4 MG/2ML IJ SOLN: 4.0000 mg | Freq: Four times a day (QID) | INTRAMUSCULAR | Status: DC | PRN

## 2023-07-07 MED ORDER — PHENYLEPHRINE 80 MCG/ML (10ML) SYRINGE FOR IV PUSH (FOR BLOOD PRESSURE SUPPORT)
PREFILLED_SYRINGE | INTRAVENOUS | Status: AC
  Filled 2023-07-07: qty 10

## 2023-07-07 MED ORDER — KETOROLAC TROMETHAMINE 30 MG/ML IJ SOLN
INTRAMUSCULAR | Status: AC
  Filled 2023-07-07: qty 1

## 2023-07-07 MED ORDER — OXYCODONE HCL 5 MG PO TABS: 5.0000 mg | ORAL_TABLET | Freq: Once | ORAL | Status: DC | PRN

## 2023-07-07 MED ORDER — SODIUM CHLORIDE 0.9 % IR SOLN
Status: DC | PRN
  Administered 2023-07-07: 1000 mL

## 2023-07-07 MED ORDER — KETOROLAC TROMETHAMINE 30 MG/ML IJ SOLN
INTRAMUSCULAR | Status: DC | PRN
Start: 2023-07-07 — End: 2023-07-07
  Administered 2023-07-07: 30 mg via INTRAVENOUS

## 2023-07-07 MED ORDER — SUGAMMADEX SODIUM 200 MG/2ML IV SOLN
INTRAVENOUS | Status: DC | PRN
  Administered 2023-07-07: 2 mg via INTRAVENOUS

## 2023-07-07 MED ORDER — PROPOFOL 10 MG/ML IV BOLUS
INTRAVENOUS | Status: DC | PRN
  Administered 2023-07-07: 140 mg via INTRAVENOUS

## 2023-07-07 MED ORDER — FENTANYL CITRATE (PF) 100 MCG/2ML IJ SOLN
INTRAMUSCULAR | Status: AC
  Filled 2023-07-07: qty 2

## 2023-07-07 MED ORDER — FENTANYL CITRATE (PF) 100 MCG/2ML IJ SOLN
25.0000 ug | INTRAMUSCULAR | Status: DC | PRN
  Administered 2023-07-07 (×2): 50 ug via INTRAVENOUS

## 2023-07-07 MED ORDER — LIDOCAINE HCL (PF) 2 % IJ SOLN
INTRAMUSCULAR | Status: AC
  Filled 2023-07-07: qty 5

## 2023-07-07 MED ORDER — LACTATED RINGERS IV SOLN: INTRAVENOUS | Status: DC

## 2023-07-07 MED ORDER — ROCURONIUM BROMIDE 10 MG/ML (PF) SYRINGE
PREFILLED_SYRINGE | INTRAVENOUS | Status: DC | PRN
  Administered 2023-07-07: 10 mg via INTRAVENOUS
  Administered 2023-07-07: 20 mg via INTRAVENOUS
  Administered 2023-07-07: 10 mg via INTRAVENOUS
  Administered 2023-07-07: 50 mg via INTRAVENOUS

## 2023-07-07 MED ORDER — DEXAMETHASONE SODIUM PHOSPHATE 10 MG/ML IJ SOLN
INTRAMUSCULAR | Status: AC
  Filled 2023-07-07: qty 1

## 2023-07-07 MED ORDER — PROPOFOL 10 MG/ML IV BOLUS
INTRAVENOUS | Status: AC
  Filled 2023-07-07: qty 20

## 2023-07-07 MED ORDER — SIMETHICONE 80 MG PO CHEW: 80.0000 mg | CHEWABLE_TABLET | Freq: Four times a day (QID) | ORAL | Status: DC | PRN

## 2023-07-07 MED ORDER — ACETAMINOPHEN 325 MG PO TABS: 650.0000 mg | ORAL_TABLET | ORAL | Status: DC | PRN

## 2023-07-07 MED ORDER — ACETAMINOPHEN 500 MG PO TABS
1000.0000 mg | ORAL_TABLET | ORAL | Status: AC
  Administered 2023-07-07: 1000 mg via ORAL

## 2023-07-07 MED ORDER — ROCURONIUM BROMIDE 10 MG/ML (PF) SYRINGE
PREFILLED_SYRINGE | INTRAVENOUS | Status: AC
  Filled 2023-07-07: qty 10

## 2023-07-07 MED ORDER — CEFAZOLIN SODIUM-DEXTROSE 2-4 GM/100ML-% IV SOLN
2.0000 g | INTRAVENOUS | Status: AC
  Administered 2023-07-07: 2 g via INTRAVENOUS

## 2023-07-07 MED ORDER — IBUPROFEN 200 MG PO TABS: 600.0000 mg | ORAL_TABLET | Freq: Four times a day (QID) | ORAL | Status: DC

## 2023-07-07 MED ORDER — DEXAMETHASONE SODIUM PHOSPHATE 10 MG/ML IJ SOLN
INTRAMUSCULAR | Status: DC | PRN
  Administered 2023-07-07: 10 mg via INTRAVENOUS

## 2023-07-07 MED ORDER — BUPIVACAINE HCL (PF) 0.25 % IJ SOLN
INTRAMUSCULAR | Status: DC | PRN
  Administered 2023-07-07: 17 mL

## 2023-07-07 MED ORDER — WHITE PETROLATUM EX OINT
TOPICAL_OINTMENT | CUTANEOUS | Status: AC
  Filled 2023-07-07: qty 5

## 2023-07-07 MED ORDER — GABAPENTIN 300 MG PO CAPS
300.0000 mg | ORAL_CAPSULE | ORAL | Status: AC
  Administered 2023-07-07: 300 mg via ORAL

## 2023-07-07 MED ORDER — STERILE WATER FOR IRRIGATION IR SOLN
Status: DC | PRN
  Administered 2023-07-07: 1000 mL

## 2023-07-07 MED ORDER — ONDANSETRON HCL 4 MG/2ML IJ SOLN
INTRAMUSCULAR | Status: AC
  Filled 2023-07-07: qty 2

## 2023-07-07 MED ORDER — ONDANSETRON HCL 4 MG PO TABS: 4.0000 mg | ORAL_TABLET | Freq: Four times a day (QID) | ORAL | Status: DC | PRN

## 2023-07-07 MED ORDER — OXYCODONE HCL 5 MG PO TABS
5.0000 mg | ORAL_TABLET | ORAL | Status: DC | PRN
  Administered 2023-07-07: 5 mg via ORAL

## 2023-07-07 MED ORDER — MIDAZOLAM HCL 2 MG/2ML IJ SOLN
INTRAMUSCULAR | Status: AC
  Filled 2023-07-07: qty 2

## 2023-07-07 MED ORDER — OXYCODONE HCL 5 MG/5ML PO SOLN: 5.0000 mg | Freq: Once | ORAL | Status: DC | PRN

## 2023-07-07 MED ORDER — MIDAZOLAM HCL 2 MG/2ML IJ SOLN
INTRAMUSCULAR | Status: DC | PRN
  Administered 2023-07-07: 2 mg via INTRAVENOUS

## 2023-07-07 MED ORDER — FENTANYL CITRATE (PF) 100 MCG/2ML IJ SOLN
INTRAMUSCULAR | Status: DC | PRN
  Administered 2023-07-07: 50 ug via INTRAVENOUS
  Administered 2023-07-07 (×2): 25 ug via INTRAVENOUS
  Administered 2023-07-07: 50 ug via INTRAVENOUS

## 2023-07-07 MED ORDER — ARTIFICIAL TEARS OPHTHALMIC OINT
TOPICAL_OINTMENT | OPHTHALMIC | Status: AC
  Filled 2023-07-07: qty 3.5

## 2023-07-07 MED ORDER — LIDOCAINE-EPINEPHRINE 1 %-1:100000 IJ SOLN
INTRAMUSCULAR | Status: DC | PRN
  Administered 2023-07-07: 15 mL

## 2023-07-07 MED ORDER — SODIUM CHLORIDE 0.9 % IR SOLN
Status: DC | PRN
  Administered 2023-07-07: 500 mL via INTRAVESICAL

## 2023-07-07 MED ORDER — LIDOCAINE 2% (20 MG/ML) 5 ML SYRINGE
INTRAMUSCULAR | Status: DC | PRN
  Administered 2023-07-07: 60 mg via INTRAVENOUS

## 2023-07-07 MED ORDER — ONDANSETRON HCL 4 MG/2ML IJ SOLN
INTRAMUSCULAR | Status: DC | PRN
  Administered 2023-07-07: 4 mg via INTRAVENOUS

## 2023-07-07 MED ORDER — PHENYLEPHRINE 80 MCG/ML (10ML) SYRINGE FOR IV PUSH (FOR BLOOD PRESSURE SUPPORT)
PREFILLED_SYRINGE | INTRAVENOUS | Status: DC | PRN
Start: 2023-07-07 — End: 2023-07-07
  Administered 2023-07-07: 80 ug via INTRAVENOUS

## 2023-07-07 SURGICAL SUPPLY — 97 items
ADH SKN CLS APL DERMABOND .7 (GAUZE/BANDAGES/DRESSINGS) ×6
AGENT HMST KT MTR STRL THRMB (HEMOSTASIS)
APL PRP STRL LF DISP 70% ISPRP (MISCELLANEOUS) ×3
BAG DRN RND TRDRP ANRFLXCHMBR (UROLOGICAL SUPPLIES) ×3
BAG URINE DRAIN 2000ML AR STRL (UROLOGICAL SUPPLIES) IMPLANT
BLADE CLIPPER SENSICLIP SURGIC (BLADE) ×4 IMPLANT
BLADE SURG 15 STRL LF DISP TIS (BLADE) ×4 IMPLANT
BLADE SURG 15 STRL SS (BLADE)
CATH FOLEY 3WAY 5CC 16FR (CATHETERS) ×4 IMPLANT
CHLORAPREP W/TINT 26 (MISCELLANEOUS) ×4 IMPLANT
COVER BACK TABLE 60X90IN (DRAPES) ×4 IMPLANT
COVER TIP SHEARS 8 DVNC (MISCELLANEOUS) ×4 IMPLANT
DEFOGGER SCOPE WARMER CLEARIFY (MISCELLANEOUS) ×4 IMPLANT
DERMABOND ADVANCED .7 DNX12 (GAUZE/BANDAGES/DRESSINGS) ×4 IMPLANT
DRAPE ARM DVNC X/XI (DISPOSABLE) ×16 IMPLANT
DRAPE COLUMN DVNC XI (DISPOSABLE) ×4 IMPLANT
DRAPE SHEET LG 3/4 BI-LAMINATE (DRAPES) ×4 IMPLANT
DRAPE SURG IRRIG POUCH 19X23 (DRAPES) ×4 IMPLANT
DRAPE UTILITY XL STRL (DRAPES) ×4 IMPLANT
DRIVER NDL LRG 8 DVNC XI (INSTRUMENTS) ×4 IMPLANT
DRIVER NDL MEGA SUTCUT DVNCXI (INSTRUMENTS) ×4 IMPLANT
DRIVER NDLE LRG 8 DVNC XI (INSTRUMENTS) ×3 IMPLANT
DRIVER NDLE MEGA SUTCUT DVNCXI (INSTRUMENTS) ×3 IMPLANT
ELECT REM PT RETURN 9FT ADLT (ELECTROSURGICAL) ×6
ELECTRODE REM PT RTRN 9FT ADLT (ELECTROSURGICAL) ×4 IMPLANT
FORCEPS BPLR 8 MD DVNC XI (FORCEP) ×4 IMPLANT
GAUZE 4X4 16PLY ~~LOC~~+RFID DBL (SPONGE) ×4 IMPLANT
GLOVE BIOGEL PI IND STRL 6.5 (GLOVE) ×16 IMPLANT
GLOVE BIOGEL PI IND STRL 7.0 (GLOVE) ×8 IMPLANT
GLOVE ECLIPSE 6.0 STRL STRAW (GLOVE) ×12 IMPLANT
GOWN STRL REUS W/TWL LRG LVL3 (GOWN DISPOSABLE) ×4 IMPLANT
GRASPER TIP-UP FEN DVNC XI (INSTRUMENTS) ×4 IMPLANT
HOLDER FOLEY CATH W/STRAP (MISCELLANEOUS) ×4 IMPLANT
IRRIG SUCT STRYKERFLOW 2 WTIP (MISCELLANEOUS) ×3
IRRIGATION SUCT STRKRFLW 2 WTP (MISCELLANEOUS) ×4 IMPLANT
IV NS 1000ML (IV SOLUTION) ×6
IV NS 1000ML BAXH (IV SOLUTION) IMPLANT
KIT PINK PAD W/HEAD ARE REST (MISCELLANEOUS) ×3
KIT PINK PAD W/HEAD ARM REST (MISCELLANEOUS) ×4 IMPLANT
KIT TURNOVER CYSTO (KITS) ×4 IMPLANT
LEGGING LITHOTOMY PAIR STRL (DRAPES) ×4 IMPLANT
MANIFOLD NEPTUNE II (INSTRUMENTS) ×4 IMPLANT
MANIPULATOR ADVINCU DEL 2.5 PL (MISCELLANEOUS) IMPLANT
MANIPULATOR ADVINCU DEL 3.0 PL (MISCELLANEOUS) IMPLANT
MANIPULATOR ADVINCU DEL 3.5 PL (MISCELLANEOUS) IMPLANT
MANIPULATOR ADVINCU DEL 4.0 PL (MISCELLANEOUS) IMPLANT
MESH VERTESSA LITE -Y 2X4X3 (Mesh General) IMPLANT
NDL HYPO 22X1.5 SAFETY MO (MISCELLANEOUS) ×4 IMPLANT
NDL INSUFFLATION 14GA 120MM (NEEDLE) ×4 IMPLANT
NEEDLE HYPO 22X1.5 SAFETY MO (MISCELLANEOUS) ×3 IMPLANT
NEEDLE INSUFFLATION 14GA 120MM (NEEDLE) ×3 IMPLANT
NS IRRIG 1000ML POUR BTL (IV SOLUTION) ×4 IMPLANT
OBTURATOR OPTICAL STND 8 DVNC (TROCAR) ×3
OBTURATOR OPTICALSTD 8 DVNC (TROCAR) ×4 IMPLANT
PACK CYSTO (CUSTOM PROCEDURE TRAY) ×4 IMPLANT
PACK ROBOT WH (CUSTOM PROCEDURE TRAY) ×4 IMPLANT
PACK ROBOTIC GOWN (GOWN DISPOSABLE) ×4 IMPLANT
PACK VAGINAL WOMENS (CUSTOM PROCEDURE TRAY) ×4 IMPLANT
PAD OB MATERNITY 4.3X12.25 (PERSONAL CARE ITEMS) ×4 IMPLANT
PAD PREP 24X48 CUFFED NSTRL (MISCELLANEOUS) ×4 IMPLANT
PENCIL SMOKE EVACUATOR (MISCELLANEOUS) IMPLANT
POUCH LAPAROSCOPIC INSTRUMENT (MISCELLANEOUS) IMPLANT
PROTECTOR NERVE ULNAR (MISCELLANEOUS) ×4 IMPLANT
RETRACTOR LONE STAR DISPOSABLE (INSTRUMENTS) ×4 IMPLANT
RETRACTOR STAY HOOK 5MM (MISCELLANEOUS) ×4 IMPLANT
SCISSORS MNPLR CVD DVNC XI (INSTRUMENTS) ×4 IMPLANT
SCRUB CHG 4% DYNA-HEX 4OZ (MISCELLANEOUS) ×4 IMPLANT
SEAL UNIV 5-12 XI (MISCELLANEOUS) ×20 IMPLANT
SEALER VESSEL EXT DVNC XI (MISCELLANEOUS) IMPLANT
SET IRRIG Y TYPE TUR BLADDER L (SET/KITS/TRAYS/PACK) ×4 IMPLANT
SET TUBE SMOKE EVAC HIGH FLOW (TUBING) ×4 IMPLANT
SLEEVE SCD COMPRESS KNEE MED (STOCKING) ×4 IMPLANT
SOL ELECTROSURG ANTI STICK (MISCELLANEOUS) ×3
SOLUTION ELECTROSURG ANTI STCK (MISCELLANEOUS) IMPLANT
SPIKE FLUID TRANSFER (MISCELLANEOUS) ×8 IMPLANT
SUCTION TUBE FRAZIER 10FR DISP (SUCTIONS) ×4 IMPLANT
SURGIFLO W/THROMBIN 8M KIT (HEMOSTASIS) IMPLANT
SUT ABS MONO DBL WITH NDL 48IN (SUTURE) IMPLANT
SUT GORETEX NAB #0 THX26 36IN (SUTURE) ×4 IMPLANT
SUT MNCRL AB 4-0 PS2 18 (SUTURE) ×4 IMPLANT
SUT MON AB 2-0 SH 27 (SUTURE) ×4 IMPLANT
SUT V-LOC BARB 180 2/0GR9 GS23 (SUTURE) ×9
SUT VIC AB 0 CT1 27 (SUTURE) ×6
SUT VIC AB 0 CT1 27XBRD ANTBC (SUTURE) ×8 IMPLANT
SUT VIC AB 2-0 SH 27 (SUTURE) ×6
SUT VIC AB 2-0 SH 27XBRD (SUTURE) ×4 IMPLANT
SUT VIC AB 3-0 SH 18 (SUTURE) IMPLANT
SUT VICRYL 2-0 SH 8X27 (SUTURE) IMPLANT
SUT VLOC 180 0 9IN GS21 (SUTURE) IMPLANT
SUT VLOC 180 2-0 9IN GS21 (SUTURE) IMPLANT
SUTURE V-LC BRB 180 2/0GR9GS23 (SUTURE) ×8 IMPLANT
SYR BULB EAR ULCER 3OZ GRN STR (SYRINGE) ×4 IMPLANT
SYS SLING ADV FIT BLUE TRNSVAG (Sling) IMPLANT
TOWEL OR 17X24 6PK STRL BLUE (TOWEL DISPOSABLE) ×4 IMPLANT
TRAY FOLEY W/BAG SLVR 14FR LF (SET/KITS/TRAYS/PACK) ×4 IMPLANT
TROCAR KII 8X100ML NONTHREADED (TROCAR) IMPLANT
WATER STERILE IRR 1000ML POUR (IV SOLUTION) IMPLANT

## 2023-07-07 NOTE — Op Note (Addendum)
Operative Note  Preoperative Diagnosis: anterior vaginal prolapse, posterior vaginal prolapse, vaginal vault prolapse after hysterectomy, and stress urinary incontinence  Postoperative Diagnosis: same  Procedures performed:  Robotic assisted sarocolpopexy, midurethral sling, cystoscopy, perineorrhaphy  Implants:  Implant Name Type Inv. Item Serial No. Manufacturer Lot No. LRB No. Used Action  MESH [employer] [id number] [employer] MESH [employer] [phone]  [facility] MEDICAL [phone] N/A 1 Implanted  SYS SLING ADV FIT BLUE TRNSVAG - [id number] Sling SYS SLING ADV FIT BLUE TRNSVAG  [employer] [id number] N/A 1 Implanted    Attending Surgeon: Lanetta Inch, MD  Anesthesia: General endotracheal  Findings: 1. On vaginal exam, stage II prolapse noted  2. On cystoscopy, normal bladder and urethra without injury, lesion or foreign body. Brisk bilateral ureteral efflux noted.     Specimens: none  Estimated blood loss: 40 mL  IV fluids: 1000 mL  Urine output: 500 mL  Complications: none  Procedure in Detail:  After informed consent was obtained, the patient was taken to the operating room, where general anesthesia was induced and found to be adequate. She was placed in dorsolithotomy position in [place] stirrups. Her hips were noted not to be hyperflexed or hyperextended. Her arms were padded with gel pads and tucked to her sides. Her hands were surrounded by foam. A padded strap was placed across her chest with foam between the pad and her skin. She was noted to be appropriately positioned with all pressure points well padded and off tension. A tilt test showed no slippage. She was prepped and draped in the usual sterile fashion.  A sterile Foley catheter was inserted.   0.25% plain Marcaine was injected above the umbilicus and an incision was made with a scalpel. A Veress needle was inserted into the incision, CO2 insufflation was started, a low opening pressure was  noted, and pneumoperitoneum was obtained. The Veress needle was removed and a 8mm robotic trocar was placed with direct visualization. Entry into the peritoneal cavity was confirmed.  After determining placement for the other ports, Local anesthetic was injected at each site and two 8 mm incisions were made for robotic ports at 10 cm lateral to and at the level of the umbilical port. Two additional 8 mm incisions were made 10 cm lateral to these and 30 degrees down followed by 8 mm robotic ports - the right side for an assistant port. All trocars were placed sequentially under direct visualization of the camera. The patient was placed in Trendelenburg. The sacrum appeared to be free of any adhesive disease.The robot was docked on the patient's right side. Monopolar endoshears were placed in the right arm, a [place] bipolar grasper was placed in the 2nd arm of the patient's left side, and a Tip up grasper was placed in the 3rd arm on the patient's left side.    Large bowel epiploica was adhered to the vaginal cuff and this was removed with monopolar scissors through a clear adhesion band. With a lucite probe in the vagina, anterior vaginal dissection was then performed with sharp dissection and electrosurgery to separate the vesicovaginal space to the level of the urethra. Thick scarring was noted at the apex. The bladder was backfilled with 200cc to find the correct vesicovaginal plane.  The posterior vaginal dissection was then performed with sharp dissection and electrosurgery in order to dissect the rectum away from the posterior vagina. There was bleeding int he area of scar dissection at the vaginal apex so this was  oversewn with a 2-0 vloc suture and good hemostasis was achieved. Attention was then turned to the sacral promontory. The peritoneum overlying the sacral promontory was tented up, dissected sharply with monopolar scissors and electrosurgery using layer by layer technique. The overlying areolar  and adipose tissue were taken down until the anterior longitudinal sacral ligament was identified. The peritoneal incision was extended down to the posterior cul-de-sac. This was performed with care to avoid the ureter on the right side and the sigmoid colon and its mesentary on the left side.   Small vessels were cauterized along the way to obtain excellent hemostasis. A "Y" mesh was then inserted into the abdomen after trimming to appropriate size. With the probe in the vagina, the anterior leaf of the Y mesh was affixed to the anterior portion of the vagina using a 2-0 v-loc suture in a spiral pattern to distribute the suture evenly across the surface of the anterior mesh leaf. In a similar fashion, the posterior leaf of the Y mesh was attached to the posterior surface of the vagina with 2-0 v-loc suture.  The distal end of the mesh was then brought to overlie the sacrum. The correct amount of tension was determined in order to elevate the vagina, but not put the mesh under tension. The distal end of the mesh was then affixed to the anterior longitudinal sacral ligament using two interrupted transverse stitches of CV2 Gortex. The excess distal mesh was then cut and removed. The peritoneum was reapproximated over the mesh using 2-0 monocryl. The bladder flap was incorporated to completely retroperitonealize the mesh. All pedicles were carefully inspected and noted to be hemostatic as the CO2 gas was deflated. All instruments were removed from the patient's abdomen.    The Foley catheter was removed.  A 70-degree cystoscope was introduced, and 360-degree inspection revealed no injury, lesion or foreign body in the bladder. Brisk bilateral ureteral efflux was noted with the assistance of pyridium.  The bladder was drained and the cystoscope was removed.  The Foley catheter was replaced.   The robot was undocked. The CO2 gas was removed and the ports were removed.  The skin incisions were closed with subcutaneous  stitches of 4-0 Monocryl and covered with skin glue.   The sling was performed next. A lonestar self-retraining retractor was placed with 4 stay hooks. The mid urethral area was located on the anterior vaginal wall.  Two Allis clamps were placed at the level of the midurethra. 1% lidocaine with epinephrine was injected into the vaginal mucosa. A vertical incision was made between the two clamps using a 15-blade scalpel.  Using sharp dissection, Metzenbaum scissors were used to make a periurethral tunnel from the vaginal incision towards the pubic rami bilaterally for the future sling tracts. The bladder was ensured to be empty. The trocar and attached sling were introduced into the right side of the periurethral vaginal incision, just inferior to the pubic symphysis on the right side. The trocar was guided through the endopelvic fascia and directly vertically.  While hugging the cephalad surface of the pubic bone, the trocar was guided out through the abdomen 2 fingerbreadths lateral to midline at the level of the pubic symphysis on the ipsilateral side. The trocar was placed on the left side in a similar fashion.  A 70-degree cystoscope was introduced, and 360-degree inspection revealed no trauma or trocars in the bladder, with brisk bilateral ureteral efflux.  The bladder was drained and the cystoscope was removed.  The Foley  catheter was reinserted.  The sling was brought to lie beneath the mid-urethra.  A needle driver was placed behind the sling to ensure no tension.   The plastic sheath was removed from the sling and the distal ends of the sling were trimmed just below the level of the skin incisions.  Tension-free positioning of the sling was confirmed. Vaginal inspection revealed no vaginotomy or sling perforations of the mucosa.  The vaginal mucosal edges were reapproximated using 2-0 Vicryl.   Hemostasis was again notedThe suprapubic sling incisions were closed with Dermabond.   Attention was then  turned to the perineum. Two allis clamps were placed at the introitus. The perineum was injected with 1% lidocaine with epinephrine. A diamond shaped incision was made over the perineum and excess skin was removed. Dissection was performed with Metzenbaum scissors to separate the mucosa from the underlying tissue. The perineal body was then reapproximated with two interrupted 0-vicryl sutures. The perineal skin was then closed with a 2-0 vicryl in a running fashion. The patient tolerated the procedure well. Sponge, lap, and needle counts were correct x 2. She was awakened from anesthesia and transferred to the recovery room in stable condition.    [name], MD

## 2023-07-07 NOTE — Anesthesia Postprocedure Evaluation (Signed)
Anesthesia Post Note  Patient: Gabrielle Rodriguez  Procedure(s) Performed: XI ROBOTIC ASSISTED LAPAROSCOPIC SACROCOLPOPEXY (Abdomen) PERINEOPLASTY (Vagina ) TRANSVAGINAL TAPE (TVT) PROCEDURE (Vagina ) CYSTOSCOPY (Bladder)     Patient location during evaluation: PACU Anesthesia Type: General Level of consciousness: awake and alert Pain management: pain level controlled Vital Signs Assessment: post-procedure vital signs reviewed and stable Respiratory status: spontaneous breathing, nonlabored ventilation, respiratory function stable and patient connected to nasal cannula oxygen Cardiovascular status: blood pressure returned to baseline and stable Postop Assessment: no apparent nausea or vomiting Anesthetic complications: no   No notable events documented.  Last Vitals:  Vitals:   07/07/23 1105 07/07/23 1122  BP:    Pulse: 74 74  Resp: 12 13  Temp:  (!) 35.9 C  SpO2: 98% 97%    Last Pain:  Vitals:   07/07/23 1122  TempSrc:   PainSc: 6                  Aqeel Norgaard S

## 2023-07-07 NOTE — Discharge Instructions (Signed)

## 2023-07-07 NOTE — Anesthesia Preprocedure Evaluation (Signed)
Anesthesia Evaluation  Patient identified by MRN, date of birth, ID band Patient awake    Reviewed: Allergy & Precautions, H&P , NPO status , Patient's Chart, lab work & pertinent test results  Airway Mallampati: II   Neck ROM: full    Dental   Pulmonary neg pulmonary ROS   breath sounds clear to auscultation       Cardiovascular negative cardio ROS  Rhythm:regular Rate:Normal     Neuro/Psych    GI/Hepatic ,GERD  ,,  Endo/Other    Renal/GU      Musculoskeletal  (+) Arthritis ,    Abdominal   Peds  Hematology   Anesthesia Other Findings   Reproductive/Obstetrics                             Anesthesia Physical Anesthesia Plan  ASA: 2  Anesthesia Plan: General   Post-op Pain Management:    Induction: Intravenous  PONV Risk Score and Plan: 3 and Ondansetron, Dexamethasone, Midazolam and Treatment may vary due to age or medical condition  Airway Management Planned: Oral ETT  Additional Equipment:   Intra-op Plan:   Post-operative Plan: Extubation in OR  Informed Consent: I have reviewed the patients History and Physical, chart, labs and discussed the procedure including the risks, benefits and alternatives for the proposed anesthesia with the patient or authorized representative who has indicated his/her understanding and acceptance.     Dental advisory given  Plan Discussed with: CRNA, Anesthesiologist and Surgeon  Anesthesia Plan Comments:        Anesthesia Quick Evaluation

## 2023-07-07 NOTE — Anesthesia Procedure Notes (Signed)
Procedure Name: Intubation Date/Time: 07/07/2023 7:34 AM  Performed by: Francie Massing, CRNAPre-anesthesia Checklist: Patient identified, Emergency Drugs available, Suction available and Patient being monitored Patient Re-evaluated:Patient Re-evaluated prior to induction Oxygen Delivery Method: Circle system utilized Preoxygenation: Pre-oxygenation with 100% oxygen Induction Type: IV induction Ventilation: Mask ventilation without difficulty Laryngoscope Size: Mac and 3 Grade View: Grade I Tube type: Oral Tube size: 7.0 mm Number of attempts: 1 Airway Equipment and Method: Stylet and Oral airway Placement Confirmation: ETT inserted through vocal cords under direct vision, positive ETCO2 and breath sounds checked- equal and bilateral Secured at: 22 cm Tube secured with: Tape Dental Injury: Teeth and Oropharynx as per pre-operative assessment

## 2023-07-07 NOTE — Interval H&P Note (Signed)
History and Physical Interval Note:  07/07/2023 7:17 AM  Gabrielle Rodriguez  has presented today for surgery, with the diagnosis of anterior vaginal prolapse; posterior vaginal prolapse; prolapse of vaginal vault after hysterectomy; stress urinary incontinence.  The various methods of treatment have been discussed with the patient and family. After consideration of risks, benefits and other options for treatment, the patient has consented to  Procedure(s): XI ROBOTIC ASSISTED LAPAROSCOPIC SACROCOLPOPEXY (N/A) PERINEOPLASTY (N/A) TRANSVAGINAL TAPE (TVT) PROCEDURE (possible) (N/A) CYSTOSCOPY (N/A) as a surgical intervention.  The patient's history has been reviewed, patient examined, no change in status, stable for surgery.  I have reviewed the patient's chart and labs.  Questions were answered to the patient's satisfaction.     [name]

## 2023-07-07 NOTE — Transfer of Care (Signed)
Immediate Anesthesia Transfer of Care Note  Patient: Gabrielle Rodriguez  Procedure(s) Performed: Procedure(s) (LRB): XI ROBOTIC ASSISTED LAPAROSCOPIC SACROCOLPOPEXY (N/A) PERINEOPLASTY (N/A) TRANSVAGINAL TAPE (TVT) PROCEDURE (N/A) CYSTOSCOPY (N/A)  Patient Location: PACU  Anesthesia Type: General  Level of Consciousness: awake, oriented, sedated and patient cooperative  Airway & Oxygen Therapy: Patient Spontanous Breathing and Patient connected to face mask oxygen  Post-op Assessment: Report given to PACU RN and Post -op Vital signs reviewed and stable  Post vital signs: Reviewed and stable  Complications: No apparent anesthesia complications Last Vitals:  Vitals Value Taken Time  BP 135/71 07/07/23 1100  Temp    Pulse 75 07/07/23 1101  Resp 19 07/07/23 1101  SpO2 97 % 07/07/23 1101  Vitals shown include unfiled device data.  Last Pain:  Vitals:   07/07/23 0650  TempSrc: Oral  PainSc: 1       Patients Stated Pain Goal: 7 (07/07/23 0650)  Complications: No notable events documented.

## 2023-07-08 ENCOUNTER — Encounter (HOSPITAL_BASED_OUTPATIENT_CLINIC_OR_DEPARTMENT_OTHER): Payer: Self-pay | Admitting: Obstetrics and Gynecology

## 2023-07-17 ENCOUNTER — Other Ambulatory Visit: Payer: Self-pay | Admitting: Obstetrics and Gynecology

## 2023-07-17 DIAGNOSIS — Z01818 Encounter for other preprocedural examination: Secondary | ICD-10-CM

## 2023-07-17 NOTE — Telephone Encounter (Signed)
Patient requested a refill on Ibuprofen 600mg . Patient stated she can buy some otc if necessary. Please advise.

## 2023-08-20 ENCOUNTER — Other Ambulatory Visit (HOSPITAL_COMMUNITY)
Admission: RE | Admit: 2023-08-20 | Discharge: 2023-08-20 | Disposition: A | Discharge status: Home / Self Care | Payer: 59 | Source: Ambulatory Visit | Attending: Obstetrics and Gynecology | Admitting: Obstetrics and Gynecology

## 2023-08-20 ENCOUNTER — Encounter: Payer: 59 | Admitting: Obstetrics and Gynecology

## 2023-08-20 ENCOUNTER — Ambulatory Visit: Payer: 59 | Admitting: Obstetrics and Gynecology

## 2023-08-20 ENCOUNTER — Encounter: Payer: Self-pay | Admitting: Obstetrics and Gynecology

## 2023-08-20 VITALS — BP 159/94 | HR 67

## 2023-08-20 DIAGNOSIS — N952 Postmenopausal atrophic vaginitis: Secondary | ICD-10-CM

## 2023-08-20 DIAGNOSIS — B3731 Acute candidiasis of vulva and vagina: Secondary | ICD-10-CM

## 2023-08-20 DIAGNOSIS — Z48816 Encounter for surgical aftercare following surgery on the genitourinary system: Secondary | ICD-10-CM

## 2023-08-20 DIAGNOSIS — B9689 Other specified bacterial agents as the cause of diseases classified elsewhere: Secondary | ICD-10-CM

## 2023-08-20 MED ORDER — ESTRADIOL 0.1 MG/GM VA CREA
TOPICAL_CREAM | VAGINAL | 11 refills | Status: DC
Start: 2023-08-20 — End: 2023-11-02

## 2023-08-20 NOTE — Progress Notes (Signed)
[facility]  Date of Visit: 08/20/2023  History of Present Illness: Ms. Steger is a 62 y.o. female scheduled today for a post-operative visit.   Surgery: s/p Robotic assisted sarocolpopexy, midurethral sling, cystoscopy on 07/07/23  She passed her postoperative void trial.   Postoperative course has been uncomplicated.   Overall pain is controlled. Occasionally takes ibuprofen. Denies urine leakage. Has a small amount of discharge, has been improving. Noticed some pressure at the opening with sitting.   UTI in the last 6 weeks? No  Pain? No  She has returned to her normal activity (except for postop restrictions) Vaginal bulge? No  Stress incontinence: No  Urgency/frequency: No  Urge incontinence: No  Voiding dysfunction: No  Bowel issues: No   Subjective Success: Do you usually have a bulge or something falling out that you can see or feel in the vaginal area? No  Retreatment Success: Any retreatment with surgery or pessary for any compartment? No   Medications: She has a current medication list which includes the following prescription(s): estradiol, acetaminophen, atorvastatin, diclofenac, ibuprofen, metformin, oxycodone, pantoprazole, polyethylene glycol powder, and valacyclovir.   Allergies: Patient has No Known Allergies.   Physical Exam: BP (!) 159/94   Pulse 67   LMP 07/03/2011 Comment: SA  Abdomen: soft, non-tender, without masses or organomegaly Laparoscopic Incisions: healing well. Mild erythema surrounding the incision to the right of midline, but no induration or discharge present.  Pelvic Examination: Sutures at introitus slightly separated, no discharge present. Vagina: well healed, no mesh or suture visible or palpable. No tenderness along the anterior or posterior vagina. No apical tenderness. Aptima swab obtained due to discharge  POP-Q: POP-Q  -3                                            Aa   -3                                            Ba  -6                                              C   3                                            Gh  4                                            Pb  6                                            tvl   -2.5  Ap  -2.5                                            Bp                                                 D     ---------------------------------------------------------  Assessment and Plan:  1. Vaginal yeast infection   2. Vaginal atrophy     - Overall healing well but will start vaginal estrogen to place at introitus nightly for two weeks then three times a week after. Will reexamine in a month.  - Can resume regular activity including exercise. Wait for intercourse until next visit. Paperwork signed to return to work in 2 weeks.  - Discussed avoidance of heavy lifting and straining long term to reduce the risk of recurrence.   All questions answered.   Return in about 1 month (around 09/20/2023).  [name], MD

## 2023-08-21 LAB — CERVICOVAGINAL ANCILLARY ONLY
Bacterial Vaginitis (gardnerella): POSITIVE — AB
Candida Glabrata: NEGATIVE
Candida Vaginitis: NEGATIVE
Comment: NEGATIVE
Comment: NEGATIVE
Comment: NEGATIVE

## 2023-08-21 MED ORDER — METRONIDAZOLE 500 MG PO TABS
500.0000 mg | ORAL_TABLET | Freq: Two times a day (BID) | ORAL | 0 refills | Status: AC
Start: 2023-08-21 — End: 2023-08-28

## 2023-08-21 NOTE — Addendum Note (Signed)
Addended by: [name] on: 08/21/2023 01:09 PM   Modules accepted: Orders

## 2023-08-24 ENCOUNTER — Encounter: Payer: Self-pay | Admitting: Obstetrics and Gynecology

## 2023-08-24 NOTE — Progress Notes (Signed)
Patient has been informed.

## 2023-09-18 ENCOUNTER — Encounter: Payer: Self-pay | Admitting: Obstetrics and Gynecology

## 2023-09-18 ENCOUNTER — Ambulatory Visit (INDEPENDENT_AMBULATORY_CARE_PROVIDER_SITE_OTHER): Payer: 59 | Admitting: Obstetrics and Gynecology

## 2023-09-18 VITALS — BP 144/84 | HR 65

## 2023-09-18 DIAGNOSIS — Z9889 Other specified postprocedural states: Secondary | ICD-10-CM

## 2023-09-18 DIAGNOSIS — Z48816 Encounter for surgical aftercare following surgery on the genitourinary system: Secondary | ICD-10-CM

## 2023-09-18 NOTE — Progress Notes (Signed)
[facility]  Date of Visit: 09/18/2023  History of Present Illness: Ms. Barrios is a 62 y.o. female scheduled today for a post-operative visit.   Surgery: s/p Robotic assisted sarocolpopexy, midurethral sling, cystoscopy on 07/07/23  She passed her postoperative void trial.   Postoperative course has been uncomplicated.   She was using the estrogen cream for about a week consistntly and then has kind of tapered off. She lifted something heavy yesterday. Denies vaginal bleeding.   Has been feeling better since taking the antibiotic.   Medications: She has a current medication list which includes the following prescription(s): acetaminophen, atorvastatin, diclofenac, estradiol, ibuprofen, lisinopril, metformin, oxycodone, pantoprazole, polyethylene glycol powder, and valacyclovir.   Allergies: Patient is allergic to bee venom.   Physical Exam: BP (!) 144/84   Pulse 65   LMP 07/03/2011 Comment: SA  Abdomen: soft, non-tender, without masses or organomegaly Laparoscopic Incisions: healing well. Mild erythema surrounding the incision to the right of midline, but no induration or discharge present.  Pelvic Examination: Introitus well healed. Vagina: well healed, no mesh or suture visible or palpable. No tenderness along the anterior or posterior vagina. No apical tenderness.   POP-Q: deferred POP-Q (08/20/23)   -3                                            Aa   -3                                           Ba   -6                                              C    3                                            Gh   4                                            Pb   6                                            tvl    -2.5                                            Ap   -2.5                                            Bp  D        ---------------------------------------------------------  Assessment and Plan:  1.  Post-operative state      - Well healed.  - continue estrace cream twice a week for vaginal dryness - Discussed avoidance of heavy lifting and straining long term to reduce the risk of recurrence.   Follow up as needed  [name], MD

## 2023-11-01 ENCOUNTER — Other Ambulatory Visit: Payer: Self-pay | Admitting: Obstetrics and Gynecology

## 2023-11-01 DIAGNOSIS — N952 Postmenopausal atrophic vaginitis: Secondary | ICD-10-CM

## 2024-02-04 ENCOUNTER — Encounter: Payer: Self-pay | Admitting: Obstetrics and Gynecology

## 2024-02-04 ENCOUNTER — Other Ambulatory Visit (HOSPITAL_COMMUNITY): Payer: Self-pay | Admitting: Obstetrics and Gynecology

## 2024-02-04 ENCOUNTER — Ambulatory Visit (INDEPENDENT_AMBULATORY_CARE_PROVIDER_SITE_OTHER): Payer: 59 | Admitting: Obstetrics and Gynecology

## 2024-02-04 VITALS — BP 118/80 | HR 65 | Ht 65.25 in | Wt 144.0 lb

## 2024-02-04 DIAGNOSIS — Z01419 Encounter for gynecological examination (general) (routine) without abnormal findings: Secondary | ICD-10-CM | POA: Diagnosis not present

## 2024-02-04 DIAGNOSIS — E2839 Other primary ovarian failure: Secondary | ICD-10-CM | POA: Diagnosis not present

## 2024-02-04 DIAGNOSIS — Z1231 Encounter for screening mammogram for malignant neoplasm of breast: Secondary | ICD-10-CM

## 2024-02-04 DIAGNOSIS — Z1331 Encounter for screening for depression: Secondary | ICD-10-CM | POA: Diagnosis not present

## 2024-02-04 NOTE — Progress Notes (Signed)
 63 y.o. y.o. female here for annual exam. Patient's last menstrual period was 07/03/2011.   [id number] Married.  73 yo daughter t.  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    RP:  Established patient presenting for annual gyn exam    HPI: S/P Total Hysterectomy.  Postmenopause, well on no hormone replacement therapy. No pelvic pain.  No pain with intercourse. 2024 Robotic assisted sarocolpopexy, midurethral sling, cystoscopy, perineorrhaphy Dr. Tildon Husky. Occasional use of vaginal estrogen.   Pap ASCUS/HPV HR Neg 11/2021.  Pap reflex 2024 normal  Normal vaginal secretions.   Bowel movements normal.  Breasts normal.  Mammo 08/2022 Neg.  Body mass index 23.61 last year. Exercises regularly, golfer.  BMD: 09/15/2016, normal. Repeat ordered.  Cologuard Neg 08/2022.  Health labs with family physician.  Body mass index is 23.78 kg/m.     02/04/2024    9:46 AM  Depression screen PHQ 2/9  Decreased Interest 0  Down, Depressed, Hopeless 0  PHQ - 2 Score 0    Blood pressure 118/80, pulse 65, height 5' 5.25" (1.657 m), weight 144 lb (65.3 kg), last menstrual period 07/03/2011, SpO2 99%.     Component Value Date/Time   DIAGPAP  11/26/2022 1441    - Negative for Intraepithelial Lesions or Malignancy (NILM)   DIAGPAP - Benign reactive/reparative changes 11/26/2022 1441   DIAGPAP (A) 11/25/2021 1441    - Atypical squamous cells of undetermined significance (ASC-US)   HPVHIGH Negative 11/25/2021 1441   ADEQPAP Satisfactory for evaluation. 11/26/2022 1441   ADEQPAP Satisfactory for evaluation. 11/25/2021 1441    GYN HISTORY:    Component Value Date/Time   DIAGPAP  11/26/2022 1441    - Negative for Intraepithelial Lesions or Malignancy (NILM)   DIAGPAP - Benign reactive/reparative changes 11/26/2022 1441   DIAGPAP (A) 11/25/2021 1441    - Atypical squamous cells of undetermined significance (ASC-US)   HPVHIGH Negative 11/25/2021 1441   ADEQPAP Satisfactory for evaluation. 11/26/2022  1441   ADEQPAP Satisfactory for evaluation. 11/25/2021 1441    OB History  Gravida Para Term Preterm AB Living  4 2 2  2 2   SAB IAB Ectopic Multiple Live Births  2        # Outcome Date GA Lbr Len/2nd Weight Sex Type Anes PTL Lv  4 SAB           3 SAB           2 Term           1 Term             Past Medical History:  Diagnosis Date   Diverticulosis of colon    GERD (gastroesophageal reflux disease)    History of cold sores    HSV infection    oral   Hyperlipidemia, mixed    Insomnia    OA (osteoarthritis)    knees   Pre-diabetes    Prolapse of female pelvic organs    SUI (stress urinary incontinence, female)    Wears contact lenses    right eye only for reading    Past Surgical History:  Procedure Laterality Date   ANTERIOR CERVICAL DECOMP/DISCECTOMY FUSION  08/2019   @ [facility] by dr Marikay Alar;  C4--5   BLADDER SUSPENSION N/A 07/07/2023   Procedure: TRANSVAGINAL TAPE (TVT) PROCEDURE;  Surgeon: [name], MD;  Location: [facility];  Service: Gynecology;  Laterality: N/A;   BUNIONECTOMY Right    1990s   CESAREAN  SECTION  1996   COLONOSCOPY N/A 11/20/2014   Procedure: COLONOSCOPY;  Surgeon: West Bali, MD;  Location: [facility];  Service: Endoscopy;  Laterality: N/A;  1:30 PM - moved to 1/11 @ 11:30 - Doris to notify pt   CYSTOCELE REPAIR N/A 07/29/2013   Procedure: ANTERIOR REPAIR (CYSTOCELE);  Surgeon: Genia Del, MD;  Location: [facility];  Service: Gynecology;  Laterality: N/A;   CYSTOSCOPY N/A 07/07/2023   Procedure: CYSTOSCOPY;  Surgeon: [name], MD;  Location: [facility];  Service: Gynecology;  Laterality: N/A;   KNEE ARTHROSCOPY WITH MEDIAL MENISECTOMY Left 09/21/2015   Procedure: KNEE ARTHROSCOPY WITH PARTIAL MEDIAL MENISECTOMY;  Surgeon: Vickki Hearing, MD;  Location: [facility];  Service: Orthopedics;  Laterality: Left;   PERINEOPLASTY N/A 07/07/2023   Procedure: PERINEOPLASTY;  Surgeon:  [name], MD;  Location: [facility];  Service: Gynecology;  Laterality: N/A;   ROBOTIC ASSISTED LAPAROSCOPIC SACROCOLPOPEXY N/A 07/07/2023   Procedure: XI ROBOTIC ASSISTED LAPAROSCOPIC SACROCOLPOPEXY;  Surgeon: [name], MD;  Location: [facility];  Service: Gynecology;  Laterality: N/A;   STRABISMUS SURGERY  1963   VAGINAL HYSTERECTOMY N/A 07/29/2013   Procedure: HYSTERECTOMY VAGINAL with McCalls;  Surgeon: Genia Del, MD;  Location: [facility];  Service: Gynecology;  Laterality: N/A;   WISDOM TOOTH EXTRACTION      Current Outpatient Medications on File Prior to Visit  Medication Sig Dispense Refill   acetaminophen (TYLENOL) 500 MG tablet Take 1 tablet (500 mg total) by mouth every 6 (six) hours as needed (pain). 30 tablet 0   ALPRAZolam (XANAX) 0.25 MG tablet Take 0.25 mg by mouth at bedtime as needed.     atorvastatin (LIPITOR) 40 MG tablet Take 1 tablet by mouth every evening.     diclofenac (VOLTAREN) 75 MG EC tablet Take 75 mg by mouth 2 (two) times daily.     EPINEPHrine 0.3 mg/0.3 mL IJ SOAJ injection INJECT 0.3 ML (0.3 MG DOSE) INTRAMUSCULARLY ONCE AS NEEDED FOR ANAPHYLAXIS UP TO 1 DOSE     ibuprofen (ADVIL) 600 MG tablet TAKE 1 TABLET BY MOUTH EVERY 6 HOURS AS NEEDED 30 tablet 0   lisinopril (ZESTRIL) 10 MG tablet Take by mouth daily. 1/2 tab a day     metFORMIN (GLUCOPHAGE-XR) 500 MG 24 hr tablet Take 500 mg by mouth every evening.     pantoprazole (PROTONIX) 40 MG tablet Take 40 mg by mouth daily.     valACYclovir (VALTREX) 1000 MG tablet Take 1,000 mg by mouth 3 (three) times daily with meals as needed (cold sores).     estradiol (ESTRACE) 0.1 MG/GM vaginal cream PLACE 0.5G (HALF A GRAM) VAGINALLY NIGHTLY FOR TWO WEEKS THEN THREE A WEEK AFTER (Patient not taking: Reported on 02/04/2024) 126 g 4   No current facility-administered medications on file prior to visit.    Social History   Socioeconomic History   Marital  status: Married    Spouse name: Not on file   Number of children: Not on file   Years of education: Not on file   Highest education level: Not on file  Occupational History   Not on file  Tobacco Use   Smoking status: Never   Smokeless tobacco: Never  Vaping Use   Vaping status: Never Used  Substance and Sexual Activity   Alcohol use: Yes    Comment: socially   Drug use: Never   Sexual activity: Yes    Partners: Male  Birth control/protection: Surgical    Comment: hysterectomy, 1st intercourse- 17, partners- 1  Other Topics Concern   Not on file  Social History Narrative   Not on file   Social Drivers of Health   Financial Resource Strain: Low Risk  (08/21/2023)   Received from [facility]   Overall Financial Resource Strain (CARDIA)    Difficulty of Paying Living Expenses: Not very hard  Food Insecurity: No Food Insecurity (08/21/2023)   Received from [facility]   Hunger Vital Sign    Worried About Running Out of Food in the Last Year: Never true    Ran Out of Food in the Last Year: Never true  Transportation Needs: No Transportation Needs (08/21/2023)   Received from [facility] - Transportation    Lack of Transportation (Medical): No    Lack of Transportation (Non-Medical): No  Physical Activity: Sufficiently Active (08/21/2023)   Received from [facility]   Exercise Vital Sign    Days of Exercise per Week: 6 days    Minutes of Exercise per Session: 60 min  Stress: No Stress Concern Present (08/21/2023)   Received from [facility] - Occupational Stress Questionnaire    Feeling of Stress : Only a little  Social Connections: Socially Integrated (08/21/2023)   Received from [facility]   Social Network    How would you rate your social network (family, work, friends)?: Good participation with social networks  Intimate Partner Violence: Not At Risk (08/21/2023)   Received from [facility]    HITS    Over the last 12 months how often did your partner physically hurt you?: Never    Over the last 12 months how often did your partner insult you or talk down to you?: Never    Over the last 12 months how often did your partner threaten you with physical harm?: Never    Over the last 12 months how often did your partner scream or curse at you?: Never    Family History  Problem Relation Age of Onset   Cancer Mother        lung   Breast cancer Maternal Grandmother    Diabetes Maternal Grandfather    Diabetes Paternal Grandmother    Hypertension Paternal Grandfather      Allergies  Allergen Reactions   Bee Venom Swelling      Patient's last menstrual period was Patient's last menstrual period was 07/03/2011.Marland Kitchen            Review of Systems Alls systems reviewed and are negative.     Physical Exam Constitutional:      Appearance: Normal appearance.  Genitourinary:     Vulva normal.     No lesions in the vagina.     Right Labia: No rash, lesions or skin changes.    Left Labia: No lesions, skin changes or rash.    Vaginal cuff intact.    No vaginal discharge, tenderness or mesh exposure.     No vaginal prolapse present.    Moderate vaginal atrophy present.     Right Adnexa: not tender and no mass present.    Left Adnexa: not tender and no mass present.    Cervix is absent.     Uterus is absent.  Breasts:    Right: Normal.     Left: Normal.  HENT:     Head: Normocephalic.  Neck:     Thyroid: No thyroid mass, thyromegaly  or thyroid tenderness.  Cardiovascular:     Rate and Rhythm: Normal rate and regular rhythm.     Heart sounds: Normal heart sounds, S1 normal and S2 normal.  Pulmonary:     Effort: Pulmonary effort is normal.     Breath sounds: Normal breath sounds and air entry.  Abdominal:     General: There is no distension.     Palpations: Abdomen is soft. There is no mass.     Tenderness: There is no abdominal tenderness. There is no guarding or  rebound.  Musculoskeletal:        General: Normal range of motion.     Cervical back: Full passive range of motion without pain, normal range of motion and neck supple. No tenderness.     Right lower leg: No edema.     Left lower leg: No edema.  Neurological:     Mental Status: She is alert.  Skin:    General: Skin is warm.  Psychiatric:        Mood and Affect: Mood normal.        Behavior: Behavior normal.        Thought Content: Thought content normal.  Vitals and nursing note reviewed. Exam conducted with a chaperone present.       A:         Well Woman GYN exam                             P:        Pap smear not indicated Encouraged annual mammogram screening Colon cancer screening up-to-date cologuard due in 2026 DXA ordered today Labs and immunizations to do with PMD Encouraged healthy lifestyle practices Encouraged Vit D and Calcium   No follow-ups on file.  Earley Favor

## 2024-02-25 ENCOUNTER — Ambulatory Visit (HOSPITAL_COMMUNITY)
Admission: RE | Admit: 2024-02-25 | Discharge: 2024-02-25 | Disposition: A | Discharge status: Home / Self Care | Source: Ambulatory Visit | Attending: Obstetrics and Gynecology | Admitting: Obstetrics and Gynecology

## 2024-02-25 ENCOUNTER — Encounter (HOSPITAL_COMMUNITY): Payer: Self-pay

## 2024-02-25 ENCOUNTER — Encounter: Payer: Self-pay | Admitting: Obstetrics and Gynecology

## 2024-02-25 DIAGNOSIS — E2839 Other primary ovarian failure: Secondary | ICD-10-CM | POA: Insufficient documentation

## 2024-02-25 DIAGNOSIS — Z1231 Encounter for screening mammogram for malignant neoplasm of breast: Secondary | ICD-10-CM | POA: Diagnosis present

## 2024-02-25 DIAGNOSIS — Z01419 Encounter for gynecological examination (general) (routine) without abnormal findings: Secondary | ICD-10-CM | POA: Insufficient documentation

## 2024-03-01 ENCOUNTER — Encounter: Payer: Self-pay | Admitting: Obstetrics and Gynecology

## 2024-10-11 ENCOUNTER — Encounter (INDEPENDENT_AMBULATORY_CARE_PROVIDER_SITE_OTHER): Payer: Self-pay | Admitting: *Deleted

## 2025-02-07 ENCOUNTER — Ambulatory Visit: Admitting: Obstetrics and Gynecology
